# Patient Record
Sex: Female | Born: 1935 | Race: Black or African American | Hispanic: No | Smoking: Current some day smoker
Health system: Southern US, Community
[De-identification: ages and names within clinical notes are randomized; demographics above are authoritative.]

## PROBLEM LIST (undated history)

## (undated) DIAGNOSIS — I10 Essential (primary) hypertension: Secondary | ICD-10-CM

## (undated) DIAGNOSIS — M1990 Unspecified osteoarthritis, unspecified site: Secondary | ICD-10-CM

## (undated) DIAGNOSIS — K219 Gastro-esophageal reflux disease without esophagitis: Secondary | ICD-10-CM

## (undated) DIAGNOSIS — K8020 Calculus of gallbladder without cholecystitis without obstruction: Secondary | ICD-10-CM

## (undated) DIAGNOSIS — K259 Gastric ulcer, unspecified as acute or chronic, without hemorrhage or perforation: Secondary | ICD-10-CM

## (undated) HISTORY — PX: CHOLECYSTECTOMY: SHX55

## (undated) HISTORY — DX: Calculus of gallbladder without cholecystitis without obstruction: K80.20

---

## 2011-11-28 ENCOUNTER — Encounter (HOSPITAL_COMMUNITY): Payer: Self-pay | Admitting: *Deleted

## 2011-11-28 ENCOUNTER — Emergency Department (HOSPITAL_COMMUNITY)
Admission: EM | Admit: 2011-11-28 | Discharge: 2011-11-28 | Disposition: A | Discharge status: Home / Self Care | Payer: 59 | Source: Home / Self Care | Attending: Emergency Medicine | Admitting: Emergency Medicine

## 2011-11-28 DIAGNOSIS — H6120 Impacted cerumen, unspecified ear: Secondary | ICD-10-CM

## 2011-11-28 DIAGNOSIS — L259 Unspecified contact dermatitis, unspecified cause: Secondary | ICD-10-CM

## 2011-11-28 MED ORDER — PREDNISONE 5 MG PO KIT: 1.0000 | PACK | Freq: Every day | ORAL | Status: DC

## 2011-11-28 MED ORDER — METHYLPREDNISOLONE ACETATE PF 80 MG/ML IJ SUSP
80.0000 mg | Freq: Once | INTRAMUSCULAR | Status: AC
  Administered 2011-11-28: 80 mg via INTRAMUSCULAR

## 2011-11-28 MED ORDER — METHYLPREDNISOLONE ACETATE 80 MG/ML IJ SUSP
INTRAMUSCULAR | Status: AC
  Filled 2011-11-28: qty 1

## 2011-11-28 MED ORDER — TRIAMCINOLONE ACETONIDE 0.1 % EX CREA: TOPICAL_CREAM | Freq: Three times a day (TID) | CUTANEOUS | Status: AC

## 2011-11-28 NOTE — ED Notes (Signed)
Itchy raised, discolored rash on the right side of her neck

## 2011-11-28 NOTE — ED Provider Notes (Signed)
Chief Complaint  Patient presents with  . Rash    History of Present Illness:   Mrs. Sydney Jordan is a 78 year old female who has had a one-week history of a rash on the right side of her neck. This is nonpainful and only mildly pruritic. She cannot think of anything that she's come in contact with. Specifically she denies any change in cosmetics, soaps, detergents, washing powders, diyer sheets, fabric softeners, or new clothing. She denies any exposure to animals or poison ivy. She's otherwise been in good health with no fever, chills, sore throat, or URI symptoms. Her only other complaint is difficulty hearing and she would like to have her ears checked today.  Review of Systems:  Other than noted above, the patient denies any of the following symptoms: Systemic:  No fever, chills, sweats, weight loss, or fatigue. ENT:  No nasal congestion, rhinorrhea, sore throat, swelling of lips, tongue or throat. Resp:  No cough, wheezing, or shortness of breath. Skin:  No rash, itching, nodules, or suspicious lesions.  PMFSH:  Past medical history, family history, social history, meds, and allergies were reviewed.  Physical Exam:   Vital signs:  BP 170/74  Pulse 82  Temp(Src) 98.2 F (36.8 C) (Oral)  Resp 20  SpO2 100% Gen:  Alert, oriented, in no distress. Skin:  There was a scaly, erythematous eruption on the entire right side of the neck. There no vesicles or maculopapules. This was diffuse redness scaling centrally erythema of her leg. It is nontender to palpation. ENT: She had a cerumen impaction in the right ear canal. There was just a small amount of cerumen left ear canal. After the cerumen had been irrigated clear, the canal and TM appear normal.  Course in Urgent Care Center:   Her ears were irrigated clear of cerumen and thereafter they appeared normal with normal canals and TMs. Thereafter she was given Depo-Medrol 80 mg IM for the allergic reaction. She tolerated this well without any  immediate side effects.  Assessment:   Diagnoses that have been ruled out:  None  Diagnoses that are still under consideration:  None  Final diagnoses:  Contact dermatitis  Cerumen impaction    Plan:   1.  The following meds were prescribed:   New Prescriptions   PREDNISONE 5 MG KIT    Take 1 kit (5 mg total) by mouth daily after breakfast. Prednisone 5 mg 6 day dosepack.  Take as directed.   TRIAMCINOLONE CREAM (KENALOG) 0.1 %    Apply topically 3 (three) times daily.   2.  The patient was instructed in symptomatic care and handouts were given. 3.  The patient was told to return if becoming worse in any way, if no better in 3 or 4 days, and given some red flag symptoms that would indicate earlier return.     Reuben Likes, MD 11/28/11 [phone]

## 2011-11-28 NOTE — Discharge Instructions (Signed)
Contact Dermatitis Contact dermatitis is a reaction to certain substances that touch the skin. Contact dermatitis can be either irritant contact dermatitis or allergic contact dermatitis. Irritant contact dermatitis does not require previous exposure to the substance for a reaction to occur.Allergic contact dermatitis only occurs if you have been exposed to the substance before. Upon a repeat exposure, your body reacts to the substance.  CAUSES  Many substances can cause contact dermatitis. Irritant dermatitis is most commonly caused by repeated exposure to mildly irritating substances, such as:  Makeup.   Soaps.   Detergents.   Bleaches.   Acids.   Metal salts, such as nickel.  Allergic contact dermatitis is most commonly caused by exposure to:  Poisonous plants.   Chemicals (deodorants, shampoos).   Jewelry.   Latex.   Neomycin in triple antibiotic cream.   Preservatives in products, including clothing.  SYMPTOMS  The area of skin that is exposed may develop:  Dryness or flaking.   Redness.   Cracks.   Itching.   Pain or a burning sensation.   Blisters.  With allergic contact dermatitis, there may also be swelling in areas such as the eyelids, mouth, or genitals.  DIAGNOSIS  Your caregiver can usually tell what the problem is by doing a physical exam. In cases where the cause is uncertain and an allergic contact dermatitis is suspected, a patch skin test may be performed to help determine the cause of your dermatitis. TREATMENT Treatment includes protecting the skin from further contact with the irritating substance by avoiding that substance if possible. Barrier creams, powders, and gloves may be helpful. Your caregiver may also recommend:  Steroid creams or ointments applied 2 times daily. For best results, soak the rash area in cool water for 20 minutes. Then apply the medicine. Cover the area with a plastic wrap. You can store the steroid cream in the  refrigerator for a "chilly" effect on your rash. That may decrease itching. Oral steroid medicines may be needed in more severe cases.   Antibiotics or antibacterial ointments if a skin infection is present.   Antihistamine lotion or an antihistamine taken by mouth to ease itching.   Lubricants to keep moisture in your skin.   Burow's solution to reduce redness and soreness or to dry a weeping rash. Mix one packet or tablet of solution in 2 cups cool water. Dip a clean washcloth in the mixture, wring it out a bit, and put it on the affected area. Leave the cloth in place for 30 minutes. Do this as often as possible throughout the day.   Taking several cornstarch or baking soda baths daily if the area is too large to cover with a washcloth.  Harsh chemicals, such as alkalis or acids, can cause skin damage that is like a burn. You should flush your skin for 15 to 20 minutes with cold water after such an exposure. You should also seek immediate medical care after exposure. Bandages (dressings), antibiotics, and pain medicine may be needed for severely irritated skin.  HOME CARE INSTRUCTIONS  Avoid the substance that caused your reaction.   Keep the area of skin that is affected away from hot water, soap, sunlight, chemicals, acidic substances, or anything else that would irritate your skin.   Do not scratch the rash. Scratching may cause the rash to become infected.   You may take cool baths to help stop the itching.   Only take over-the-counter or prescription medicines as directed by your caregiver.     See your caregiver for follow-up care as directed to make sure your skin is healing properly.  SEEK MEDICAL CARE IF:   Your condition is not better after 3 days of treatment.   You seem to be getting worse.   You see signs of infection such as swelling, tenderness, redness, soreness, or warmth in the affected area.   You have any problems related to your medicines.  Document Released:  09/01/2000 Document Revised: 08/24/2011 Document Reviewed: 02/07/2011 [facility] Patient Information 2012 [place], [place].  Blood pressure over the ideal can put you at higher risk for stroke, heart disease, and kidney failure.  For this reason, it's important to try to get your blood pressure as close as possible to the ideal.  The ideal blood pressure is 120/80.  Blood pressures from 120-139 systolic over 80-89 diastolic are labeled as "prehypertension."  This means you are at higher risk of developing hypertension in the future.  Blood pressures in this range are not treated with medication, but lifestyle changes are recommended to prevent progression to hypertension.  Blood pressures of 140 and above systolic over 90 and above diastolic are classified as hypertension and are treated with medications.  Lifestyle changes which can benefit both prehypertension and hypertension include the following:   Salt and sodium restriction.  Weight loss.  Regular exercise.  Avoidance of tobacco.  Avoidance of excess alcohol.  The "D.A.S.H" diet.   People with hypertension and prehypertension should limit their salt intake to less than 1500 mg daily.  Reading the nutrition information on the label of many prepared foods can give you an idea of how much sodium you're consuming at each meal.  Remember that the most important number on the nutrition information is the serving size.  It may be smaller than you think.  Try to avoid adding extra salt at the table.  You may add small amounts of salt while cooking.  Remember that salt is an acquired taste and you may get used to a using a whole lot less salt than you are using now.  Using less salt lets the food's natural flavors come through.  You might want to consider using salt substitutes, potassium chloride, pepper, or blends of herbs and spices to enhance the flavor of your food.  Foods that contain the most salt include: processed meats (like ham, bacon,  lunch meat, sausage, hot dogs, and breakfast meat), chips, pretzels, salted nuts, soups, salty snacks, canned foods, junk food, fast food, restaurant food, mustard, pickles, pizza, popcorn, soy sauce, and worcestershire sauce--quite a list!  You might ask, "Is there anything I can eat?"  The answer is, "yes."  Fruits and vegetables are usually low in salt.  Fresh is better than frozen which is better than canned.  If you have canned vegetables, you can cut down on the salt content by rinsing them in tap water 3 times before cooking.     Weight loss is the second thing you can do to lower your blood pressure.  Getting to and maintaining ideal weight will often normalize your blood pressure and allow you to avoid medications, entirely, cut way down on your dosage of medications, or allow to wean off your meds.  (Note, this should only be done under the supervision of your primary care doctor.)  Of course, weight loss takes time and you may need to be on medication in the meantime.  You shoot for a body mass index of 20-25.  When you go to the urgent  care or to your primary care doctor, they should calculate your BMI.  If you don't know what it is, ask.  You can calculate your BMI with the following formula:  Weight in pounds x 703/ (height in inches) x (height in inches).  There are many good diets out there: Weight Watchers and the D.A.S.H. Diet are the best, but often, just modifying a few factors can be helpful:  Don't skip meals, don't eat out, and keeping a food diary.  I do not recommend fad diets or diet pills which often raise blood pressure.    Everyone should get regular exercise, but this is particularly important for people with high blood pressure.  Just about any exercise is good.  The only exercise which may be harmful is lifting extreme heavy weights.  I recommend moderate exercise such as walking for 30 minutes 5 days a week.  Going to the gym for a 50 minute workout 3 times a week is also good.   This amounts to 150 minutes of exercise weekly.   Anyone with high blood pressure should avoid any use of tobacco.  Tobacco use does not elevate blood pressure, but it increases the risk of heart disease and stroke.  If you are interested in quitting, discuss with your doctor how to quit.  If you are not interested in quitting, ask yourself, "What would my life be like in 10 years if I continue to smoke?"  "How will I know when it is time to quit?"  "How would my life be better if I were to quit."   Excess alcohol intake can raise the blood pressure.  The safe alcohol intake is 2 drinks or less per day for men and 1 drink per day or less for women.   There is a very good diet which I recommend that has been designed for people with blood pressure called the D.A.S.H. Diet (dietary approaches to stop hypertension).  It consists of fruits, vegetables, lean meats, low fat dairy, whole grains, nuts and seeds.  It is very low in salt and sodium.  It has also been found to have other beneficial health effects such as lowering cholesterol and helping lose weight.  It has been developed by the [employer] and can be downloaded from the internet without any cost. Just do a Programmer, multimedia on "D.A.S.H. Diet." or go the NIH website (GolfingPosters.tn).  There are also cookbooks and diet plans that can be gotten from [place] to help you with this diet.   Follow up with a primary care doctor as soon as possible regarding your blood pressure.  It was very high today.  Call [phone] for a referral.

## 2013-12-20 ENCOUNTER — Encounter (HOSPITAL_COMMUNITY): Payer: Self-pay | Admitting: Emergency Medicine

## 2013-12-20 ENCOUNTER — Emergency Department (HOSPITAL_COMMUNITY)
Admission: EM | Admit: 2013-12-20 | Discharge: 2013-12-20 | Disposition: A | Discharge status: Home / Self Care | Payer: Medicare Other | Attending: Emergency Medicine | Admitting: Emergency Medicine

## 2013-12-20 ENCOUNTER — Emergency Department (HOSPITAL_COMMUNITY): Payer: Medicare Other

## 2013-12-20 DIAGNOSIS — Z79899 Other long term (current) drug therapy: Secondary | ICD-10-CM | POA: Insufficient documentation

## 2013-12-20 DIAGNOSIS — K8020 Calculus of gallbladder without cholecystitis without obstruction: Secondary | ICD-10-CM

## 2013-12-20 DIAGNOSIS — F17200 Nicotine dependence, unspecified, uncomplicated: Secondary | ICD-10-CM | POA: Insufficient documentation

## 2013-12-20 DIAGNOSIS — K8050 Calculus of bile duct without cholangitis or cholecystitis without obstruction: Secondary | ICD-10-CM

## 2013-12-20 HISTORY — DX: Gastric ulcer, unspecified as acute or chronic, without hemorrhage or perforation: K25.9

## 2013-12-20 LAB — CBC WITH DIFFERENTIAL/PLATELET
BASOS PCT: 1 % (ref 0–1)
Basophils Absolute: 0.1 10*3/uL (ref 0.0–0.1)
EOS ABS: 0 10*3/uL (ref 0.0–0.7)
EOS PCT: 0 % (ref 0–5)
HEMATOCRIT: 39.6 % (ref 36.0–46.0)
HEMOGLOBIN: 13.4 g/dL (ref 12.0–15.0)
Lymphocytes Relative: 17 % (ref 12–46)
Lymphs Abs: 1.6 10*3/uL (ref 0.7–4.0)
MCH: 25.1 pg — AB (ref 26.0–34.0)
MCHC: 33.8 g/dL (ref 30.0–36.0)
MCV: 74.2 fL — AB (ref 78.0–100.0)
MONO ABS: 0.8 10*3/uL (ref 0.1–1.0)
MONOS PCT: 9 % (ref 3–12)
Neutro Abs: 6.7 10*3/uL (ref 1.7–7.7)
Neutrophils Relative %: 73 % (ref 43–77)
Platelets: 195 10*3/uL (ref 150–400)
RBC: 5.34 MIL/uL — ABNORMAL HIGH (ref 3.87–5.11)
RDW: 15.9 % — ABNORMAL HIGH (ref 11.5–15.5)
WBC: 9.1 10*3/uL (ref 4.0–10.5)

## 2013-12-20 LAB — URINALYSIS, ROUTINE W REFLEX MICROSCOPIC
Glucose, UA: NEGATIVE mg/dL
Hgb urine dipstick: NEGATIVE
KETONES UR: 15 mg/dL — AB
LEUKOCYTES UA: NEGATIVE
NITRITE: NEGATIVE
PH: 6.5 (ref 5.0–8.0)
Protein, ur: 30 mg/dL — AB
Specific Gravity, Urine: 1.027 (ref 1.005–1.030)
Urobilinogen, UA: 1 mg/dL (ref 0.0–1.0)

## 2013-12-20 LAB — URINE MICROSCOPIC-ADD ON

## 2013-12-20 LAB — COMPREHENSIVE METABOLIC PANEL
ALBUMIN: 4.6 g/dL (ref 3.5–5.2)
ALT: 20 U/L (ref 0–35)
AST: 29 U/L (ref 0–37)
Alkaline Phosphatase: 57 U/L (ref 39–117)
BILIRUBIN TOTAL: 0.6 mg/dL (ref 0.3–1.2)
BUN: 18 mg/dL (ref 6–23)
CHLORIDE: 94 meq/L — AB (ref 96–112)
CO2: 22 mEq/L (ref 19–32)
CREATININE: 0.71 mg/dL (ref 0.50–1.10)
Calcium: 10.2 mg/dL (ref 8.4–10.5)
GFR calc Af Amer: >90 mL/min (ref >90)
GFR calc non Af Amer: 79 mL/min — ABNORMAL LOW (ref >90)
Glucose, Bld: 118 mg/dL — ABNORMAL HIGH (ref 70–99)
Potassium: 4.1 mEq/L (ref 3.7–5.3)
Sodium: 134 mEq/L — ABNORMAL LOW (ref 137–147)
TOTAL PROTEIN: 8.4 g/dL — AB (ref 6.0–8.3)

## 2013-12-20 LAB — LIPASE, BLOOD: LIPASE: 27 U/L (ref 11–59)

## 2013-12-20 LAB — I-STAT TROPONIN, ED: Troponin i, poc: 0 ng/mL (ref 0.00–0.08)

## 2013-12-20 MED ORDER — METOCLOPRAMIDE HCL 10 MG PO TABS: 10.0000 mg | ORAL_TABLET | Freq: Four times a day (QID) | ORAL | Status: DC | PRN

## 2013-12-20 MED ORDER — OXYCODONE-ACETAMINOPHEN 5-325 MG PO TABS: 1.0000 | ORAL_TABLET | ORAL | Status: DC | PRN

## 2013-12-20 MED ORDER — ONDANSETRON 8 MG PO TBDP: ORAL_TABLET | ORAL | Status: DC

## 2013-12-20 NOTE — ED Notes (Signed)
Pt encouraged to void when able. 

## 2013-12-20 NOTE — ED Provider Notes (Signed)
CSN: [id number]     Arrival date & time 12/20/13  1010 History   First MD Initiated Contact with Patient 12/20/13 1015     Chief Complaint  Patient presents with  . Abdominal Pain  . Nausea  . Emesis     (Consider location/radiation/quality/duration/timing/severity/associated sxs/prior Treatment) HPI 80 year old female with no major medical history presents with a four-day history of several episodes of intermittent epigastric pain lasting a few hours at a time with nonbloody vomiting occurs after she eats solid foods but not when she drinks liquids; she is not currently having abdominal pain now; she is no fever no cough no chest pain no shortness breath no lower abdominal pain no bloody stools no diarrhea no dysuria no rash no trauma no treatment prior to arrival; her episodes are moderately severe when she has them. Past Medical History  Diagnosis Date  . Multiple gastric ulcers    History reviewed. No pertinent past surgical history. History reviewed. No pertinent family history. History  Substance Use Topics  . Smoking status: Current Some Day Smoker -- 0.50 packs/day for 50 years  . Smokeless tobacco: Not on file  . Alcohol Use: No   OB History   Grav Para Term Preterm Abortions TAB SAB Ect Mult Living                 Review of Systems  10 Systems reviewed and are negative for acute change except as noted in the HPI.  Allergies  Review of patient's allergies indicates no known allergies.  Home Medications   Current Outpatient Rx  Name  Route  Sig  Dispense  Refill  . ranitidine (ZANTAC) 150 MG tablet   Oral   Take 150 mg by mouth 2 (two) times daily.         . metoCLOPramide (REGLAN) 10 MG tablet   Oral   Take 1 tablet (10 mg total) by mouth every 6 (six) hours as needed for nausea (nausea/headache).   6 tablet   0   . ondansetron (ZOFRAN ODT) 8 MG disintegrating tablet      8mg  ODT q4 hours prn nausea   4 tablet   0   . oxyCODONE-acetaminophen  (PERCOCET) 5-325 MG per tablet   Oral   Take 1 tablet by mouth every 4 (four) hours as needed.   10 tablet   0    BP 128/76  Pulse 78  Temp(Src) 98 F (36.7 C) (Oral)  Resp 17  SpO2 98% Physical Exam  Nursing note and vitals reviewed. Constitutional:  Awake, alert, nontoxic appearance.  HENT:  Head: Atraumatic.  Eyes: Right eye exhibits no discharge. Left eye exhibits no discharge.  Neck: Neck supple.  Cardiovascular: Normal rate and regular rhythm.   No murmur heard. Pulmonary/Chest: Effort normal and breath sounds normal. No respiratory distress. She has no wheezes. She has no rales. She exhibits no tenderness.  Abdominal: Soft. Bowel sounds are normal. She exhibits no distension and no mass. There is no tenderness. There is no rebound and no guarding.  Musculoskeletal: She exhibits no tenderness.  Baseline ROM, no obvious new focal weakness.  Neurological: She is alert.  Mental status and motor strength appears baseline for patient and situation. Normal speech and gait.  Skin: No rash noted.  Psychiatric: She has a normal mood and affect.    ED Course  Procedures (including critical care time) Patient / Family / Caregiver understand and agree with initial ED impression and plan with expectations set for ED  visit. Pain-free in ED. Suspect biliary colic. Appears stable for outpatient followup with surgery. Patient / Family / Caregiver informed of clinical course, understand medical decision-making process, and agree with plan. Labs Review Labs Reviewed  CBC WITH DIFFERENTIAL - Abnormal; Notable for the following:    RBC 5.34 (*)    MCV 74.2 (*)    MCH 25.1 (*)    RDW 15.9 (*)    All other components within normal limits  COMPREHENSIVE METABOLIC PANEL - Abnormal; Notable for the following:    Sodium 134 (*)    Chloride 94 (*)    Glucose, Bld 118 (*)    Total Protein 8.4 (*)    GFR calc non Af Amer 79 (*)    All other components within normal limits  URINALYSIS,  ROUTINE W REFLEX MICROSCOPIC - Abnormal; Notable for the following:    Color, Urine AMBER (*)    Bilirubin Urine SMALL (*)    Ketones, ur 15 (*)    Protein, ur 30 (*)    All other components within normal limits  LIPASE, BLOOD  URINE MICROSCOPIC-ADD ON  I-STAT TROPOININ, ED   Imaging Review US Abdomen Complete  12/20/2013   CLINICAL DATA:  epigastric pain  EXAM: ULTRASOUND ABDOMEN COMPLETE  COMPARISON:  None.  FINDINGS: Gallbladder:  Multiple gallstones are appreciated within the gallbladder largest measuring 9 mm in diameter. No gallbladder wall thickening, measuring 2 mm. No sonographic Murphy's sign. The gallbladder is distended.  Common bile duct:  Diameter: 3 mm  Liver:  No focal lesion identified. Within normal limits in parenchymal echogenicity.  IVC:  No abnormality visualized.  Pancreas:  Visualized portion unremarkable.  Spleen:  Size and appearance within normal limits.  Right Kidney:  Length: 10.4 cm. Echogenicity within normal limits. No mass or hydronephrosis visualized.  Left Kidney:  Length: 9.5 cm. Echogenicity within normal limits. No mass or hydronephrosis visualized.  Abdominal aorta:  No aneurysm visualized, mural calcifications are appreciated within the aortic wall. .  Other findings:  None.  IMPRESSION: Gallstones without evidence of acute cholecystitis.   Electronically Signed   By: Margaree Mackintosh M.D.   On: 12/20/2013 11:56     EKG Interpretation   Date/Time:  Saturday December 20 2013 10:27:26 EDT Ventricular Rate:  87 PR Interval:  161 QRS Duration: 61 QT Interval:  361 QTC Calculation: 434 R Axis:   78 Text Interpretation:  Sinus rhythm Probable left atrial enlargement  Baseline wander in lead(s) V2 No previous ECGs available Confirmed by  [facility]  MD, Jenny Reichmann ([id number]) on 12/20/2013 10:34:59 AM      MDM   Final diagnoses:  Biliary colic  Cholelithiasis    I doubt any other EMC precluding discharge at this time including, but not necessarily limited to the  following:cholecystitis.    Babette Relic, MD 12/20/13 [phone]

## 2013-12-20 NOTE — ED Notes (Signed)
US at bedside

## 2013-12-20 NOTE — ED Notes (Signed)
Pt. Aware of giving a urine specimen.  Pt. Unable to urinate at this time. Will collect. Nurses was notified.

## 2013-12-20 NOTE — ED Notes (Signed)
Pt c/o abd pain and vomiting since Wednesday.  Denies diarrhea.  C/o mid abd pain.  Hx of gastric ulcers.

## 2013-12-20 NOTE — ED Notes (Signed)
Pt denies vomiting blood.

## 2013-12-20 NOTE — Discharge Instructions (Signed)
Biliary Colic  °Biliary colic is a steady or irregular pain in the upper abdomen. It is usually under the right side of the rib cage. It happens when gallstones interfere with the normal flow of bile from the gallbladder. Bile is a liquid that helps to digest fats. Bile is made in the liver and stored in the gallbladder. When you eat a meal, bile passes from the gallbladder through the cystic duct and the common bile duct into the small intestine. There, it mixes with partially digested food. If a gallstone blocks either of these ducts, the normal flow of bile is blocked. The muscle cells in the bile duct contract forcefully to try to move the stone. This causes the pain of biliary colic.  °SYMPTOMS  °· A person with biliary colic usually complains of pain in the upper abdomen. This pain can be: °· In the center of the upper abdomen just below the breastbone. °· In the upper-right part of the abdomen, near the gallbladder and liver. °· Spread back toward the right shoulder blade. °· Nausea and vomiting. °· The pain usually occurs after eating. °· Biliary colic is usually triggered by the digestive system's demand for bile. The demand for bile is high after fatty meals. Symptoms can also occur when a person who has been fasting suddenly eats a very large meal. Most episodes of biliary colic pass after 1 to 5 hours. After the most intense pain passes, your abdomen may continue to ache mildly for about 24 hours. °DIAGNOSIS  °After you describe your symptoms, your caregiver will perform a physical exam. He or she will pay attention to the upper right portion of your belly (abdomen). This is the area of your liver and gallbladder. An ultrasound will help your caregiver look for gallstones. Specialized scans of the gallbladder may also be done. Blood tests may be done, especially if you have fever or if your pain persists. °PREVENTION  °Biliary colic can be prevented by controlling the risk factors for gallstones. Some of  these risk factors, such as heredity, increasing age, and pregnancy are a normal part of life. Obesity and a high-fat diet are risk factors you can change through a healthy lifestyle. Women going through menopause who take hormone replacement therapy (estrogen) are also more likely to develop biliary colic. °TREATMENT  °· Pain medication may be prescribed. °· You may be encouraged to eat a fat-free diet. °· If the first episode of biliary colic is severe, or episodes of colic keep retuning, surgery to remove the gallbladder (cholecystectomy) is usually recommended. This procedure can be done through small incisions using an instrument called a laparoscope. The procedure often requires a brief stay in the hospital. Some people can leave the hospital the same day. It is the most widely used treatment in people troubled by painful gallstones. It is effective and safe, with no complications in more than 90% of cases. °· If surgery cannot be done, medication that dissolves gallstones may be used. This medication is expensive and can take months or years to work. Only small stones will dissolve. °· Rarely, medication to dissolve gallstones is combined with a procedure called shock-wave lithotripsy. This procedure uses carefully aimed shock waves to break up gallstones. In many people treated with this procedure, gallstones form again within a few years. °PROGNOSIS  °If gallstones block your cystic duct or common bile duct, you are at risk for repeated episodes of biliary colic. There is also a 25% chance that you will develop   a gallbladder infection(acute cholecystitis), or some other complication of gallstones within 10 to 20 years. If you have surgery, schedule it at a time that is convenient for you and at a time when you are not sick. HOME CARE INSTRUCTIONS   Drink plenty of clear fluids.  Avoid fatty, greasy or fried foods, or any foods that make your pain worse.  Take medications as directed. SEEK MEDICAL  CARE IF:   You develop a fever over 100.5 F (38.1 C).  Your pain gets worse over time.  You develop nausea that prevents you from eating and drinking.  You develop vomiting. SEEK IMMEDIATE MEDICAL CARE IF:   You have continuous or severe belly (abdominal) pain which is not relieved with medications.  You develop nausea and vomiting which is not relieved with medications.  You have symptoms of biliary colic and you suddenly develop a fever and shaking chills. This may signal cholecystitis. Call your caregiver immediately.  You develop a yellow color to your skin or the white part of your eyes (jaundice). Document Released: 02/05/2006 Document Revised: 11/27/2011 Document Reviewed: 04/16/2008 [facility] Patient Information 2014 [employer]. Abdominal (belly) pain can be caused by many things. Your caregiver performed an examination and possibly ordered blood/urine tests and imaging (CT scan, x-rays, ultrasound). Many cases can be observed and treated at home after initial evaluation in the emergency department. Even though you are being discharged home, abdominal pain can be unpredictable. Therefore, you need a repeated exam if your pain does not resolve, returns, or worsens. Most patients with abdominal pain don't have to be admitted to the hospital or have surgery, but serious problems like appendicitis and gallbladder attacks can start out as nonspecific pain. Many abdominal conditions cannot be diagnosed in one visit, so follow-up evaluations are very important. SEEK IMMEDIATE MEDICAL ATTENTION IF: The pain becomes severe or lasts over 6 hours.  A temperature above 101 develops.  Repeated vomiting occurs (multiple episodes).  The pain becomes localized to portions of the abdomen. The right side could possibly be appendicitis. In an adult, the left lower portion of the abdomen could be colitis or diverticulitis.  Blood is being passed in stools or vomit (bright red or black tarry  stools).  Return also if you develop chest pain, difficulty breathing, dizziness or fainting, or become confused, poorly responsive, or inconsolable (young children).

## 2014-01-06 ENCOUNTER — Encounter (INDEPENDENT_AMBULATORY_CARE_PROVIDER_SITE_OTHER): Payer: Self-pay | Admitting: Surgery

## 2014-01-06 ENCOUNTER — Ambulatory Visit (INDEPENDENT_AMBULATORY_CARE_PROVIDER_SITE_OTHER): Payer: Medicare Other | Admitting: Surgery

## 2014-01-06 VITALS — BP 140/76 | HR 80 | Temp 97.4°F | Resp 14 | Ht 63.0 in | Wt 90.0 lb

## 2014-01-06 DIAGNOSIS — K8020 Calculus of gallbladder without cholecystitis without obstruction: Secondary | ICD-10-CM

## 2014-01-06 NOTE — Progress Notes (Signed)
Patient ID: Sydney Jordan, female   DOB: [birth year], 80 y.o.   MRN: [id number]  Chief Complaint  Patient presents with  . New Evaluation    eval gallstones    HPI Sydney Jordan is a 80 y.o. female.   HPI Patient sent a request of Dr. Teena Irani for symptomatic cholelithiasis. She has a history of epigastric pain after eating and significant nausea and vomiting after eating. This has been going on for least a year. Ultrasound showed gallstones. He seen the emergency room with EKG changes and referred to cardiology.Denies any chest pain or shortness of breath with exertion. Discomfort and nausea occur after eating especially fatty greasy meals. No blood in her emesis.  Past Medical History  Diagnosis Date  . Multiple gastric ulcers     History reviewed. No pertinent past surgical history.  History reviewed. No pertinent family history.  Social History History  Substance Use Topics  . Smoking status: Current Some Day Smoker -- 0.50 packs/day for 50 years  . Smokeless tobacco: Not on file  . Alcohol Use: No    No Known Allergies  Current Outpatient Prescriptions  Medication Sig Dispense Refill  . metoCLOPramide (REGLAN) 10 MG tablet Take 1 tablet (10 mg total) by mouth every 6 (six) hours as needed for nausea (nausea/headache).  6 tablet  0  . ondansetron (ZOFRAN ODT) 8 MG disintegrating tablet 8mg  ODT q4 hours prn nausea  4 tablet  0  . oxyCODONE-acetaminophen (PERCOCET) 5-325 MG per tablet Take 1 tablet by mouth every 4 (four) hours as needed.  10 tablet  0  . ranitidine (ZANTAC) 150 MG tablet Take 150 mg by mouth 2 (two) times daily.       No current facility-administered medications for this visit.    Review of Systems Review of Systems  Constitutional: Negative for fever, chills and unexpected weight change.  HENT: Negative for congestion, hearing loss, sore throat, trouble swallowing and voice change.   Eyes: Negative for visual disturbance.  Respiratory: Negative for  cough and wheezing.   Cardiovascular: Negative for chest pain, palpitations and leg swelling.  Gastrointestinal: Positive for vomiting and abdominal pain. Negative for nausea, diarrhea, constipation, blood in stool, abdominal distention and anal bleeding.  Genitourinary: Negative for hematuria, vaginal bleeding and difficulty urinating.  Musculoskeletal: Negative for arthralgias.  Skin: Negative for rash and wound.  Neurological: Negative for seizures, syncope and headaches.  Hematological: Negative for adenopathy. Does not bruise/bleed easily.  Psychiatric/Behavioral: Negative for confusion.    Blood pressure 140/76, pulse 80, temperature 97.4 F (36.3 C), temperature source Temporal, resp. rate 14, height 5\' 3"  (1.6 m), weight 90 lb (40.824 kg).  Physical Exam Physical Exam  Constitutional: She is oriented to person, place, and time. She appears well-developed and well-nourished.  HENT:  Head: Normocephalic.  Mouth/Throat: No oropharyngeal exudate.  Eyes: EOM are normal. Pupils are equal, round, and reactive to light. No scleral icterus.  Neck: Normal range of motion. Neck supple.  Cardiovascular: Normal rate and regular rhythm.   Pulmonary/Chest: Effort normal and breath sounds normal.  Abdominal: Soft. Bowel sounds are normal. She exhibits no distension. There is tenderness in the right upper quadrant.  Musculoskeletal: Normal range of motion.  Neurological: She is alert and oriented to person, place, and time.  Skin: Skin is warm and dry.  Psychiatric: She has a normal mood and affect. Her behavior is normal. Judgment and thought content normal.    Data Reviewed CLINICAL DATA: epigastric pain  EXAM:  ULTRASOUND  ABDOMEN COMPLETE  COMPARISON: None.  FINDINGS:  Gallbladder:  Multiple gallstones are appreciated within the gallbladder largest  measuring 9 mm in diameter. No gallbladder wall thickening,  measuring 2 mm. No sonographic Murphy's sign. The gallbladder is   distended.  Common bile duct:  Diameter: 3 mm  Liver:  No focal lesion identified. Within normal limits in parenchymal  echogenicity.  IVC:  No abnormality visualized.  Pancreas:  Visualized portion unremarkable.  Spleen:  Size and appearance within normal limits.  Right Kidney:  Length: 10.4 cm. Echogenicity within normal limits. No mass or  hydronephrosis visualized.  Left Kidney:  Length: 9.5 cm. Echogenicity within normal limits. No mass or  hydronephrosis visualized.  Abdominal aorta:  No aneurysm visualized, mural calcifications are appreciated within  the aortic wall. .  Other findings:  None.  IMPRESSION:  Gallstones without evidence of acute cholecystitis.  Electronically Signed  By: Salome Holmes M.D.  On: 12/20/2013 11:56   Assessment    Symptomatic cholelithiasis  EKG changes    Plan    Recommend laparoscopic cholecystectomy and intraoperative cholangiogram after cardiology clearance.The procedure has been discussed with the patient. Operative and non operative treatments have been discussed. Risks of surgery include bleeding, infection,  Common bile duct injury,  Injury to the stomach,liver, colon,small intestine, abdominal wall,  Diaphragm,  Major blood vessels,  And the need for an open procedure.  Other risks include worsening of medical problems, death,  DVT and pulmonary embolism, and cardiovascular events.   Medical options have also been discussed. The patient has been informed of long term expectations of surgery and non surgical options,  The patient agrees to proceed.         Gyan Cambre A. Chayse Zatarain 01/06/2014, 3:46 PM

## 2014-01-06 NOTE — Patient Instructions (Signed)
Laparoscopic Cholecystectomy °Laparoscopic cholecystectomy is surgery to remove the gallbladder. The gallbladder is located in the upper right part of the abdomen, behind the liver. It is a storage sac for bile produced in the liver. Bile aids in the digestion and absorption of fats. Cholecystectomy is often done for inflammation of the gallbladder (cholecystitis). This condition is usually caused by a buildup of gallstones (cholelithiasis) in your gallbladder. Gallstones can block the flow of bile, resulting in inflammation and pain. In severe cases, emergency surgery may be required. When emergency surgery is not required, you will have time to prepare for the procedure. °Laparoscopic surgery is an alternative to open surgery. Laparoscopic surgery has a shorter recovery time. Your common bile duct may also need to be examined during the procedure. If stones are found in the common bile duct, they may be removed. °LET YOUR HEALTH CARE PROVIDER KNOW ABOUT: °· Any allergies you have. °· All medicines you are taking, including vitamins, herbs, eye drops, creams, and over-the-counter medicines. °· Previous problems you or members of your family have had with the use of anesthetics. °· Any blood disorders you have. °· Previous surgeries you have had. °· Medical conditions you have. °RISKS AND COMPLICATIONS °Generally, this is a safe procedure. However, as with any procedure, complications can occur. Possible complications include: °· Infection. °· Damage to the common bile duct, nerves, arteries, veins, or other internal organs such as the stomach, liver, or intestines. °· Bleeding. °· A stone may remain in the common bile duct. °· A bile leak from the cyst duct that is clipped when your gallbladder is removed. °· The need to convert to open surgery, which requires a larger incision in the abdomen. This may be necessary if your surgeon thinks it is not safe to continue with a laparoscopic procedure. °BEFORE THE  PROCEDURE °· Ask your health care provider about changing or stopping any regular medicines. You will need to stop taking aspirin or blood thinners at least 5 days prior to surgery. °· Do not eat or drink anything after midnight the night before surgery. °· Let your health care provider know if you develop a cold or other infectious problem before surgery. °PROCEDURE  °· You will be given medicine to make you sleep through the procedure (general anesthetic). A breathing tube will be placed in your mouth. °· When you are asleep, your surgeon will make several small cuts (incisions) in your abdomen. °· A thin, lighted tube with a tiny camera on the end (laparoscope) is inserted through one of the small incisions. The camera on the laparoscope sends a picture to a TV screen in the operating room. This gives the surgeon a good view inside your abdomen. °· A gas will be pumped into your abdomen. This expands your abdomen so that the surgeon has more room to perform the surgery. °· Other tools needed for the procedure are inserted through the other incisions. The gallbladder is removed through one of the incisions. °· After the removal of your gallbladder, the incisions will be closed with stitches, staples, or skin glue. °AFTER THE PROCEDURE °· You will be taken to a recovery area where your progress will be checked often. °· You may be allowed to go home the same day if your pain is controlled and you can tolerate liquids. °Document Released: 09/04/2005 Document Revised: 06/25/2013 Document Reviewed: 04/16/2013 °[employer]® Patient Information ©2014 [employer]. ° °

## 2014-01-19 ENCOUNTER — Encounter: Payer: Self-pay | Admitting: Cardiovascular Disease

## 2014-01-19 ENCOUNTER — Ambulatory Visit (INDEPENDENT_AMBULATORY_CARE_PROVIDER_SITE_OTHER): Payer: Medicare Other | Admitting: Cardiovascular Disease

## 2014-01-19 VITALS — BP 138/72 | HR 69 | Ht 63.0 in | Wt 90.8 lb

## 2014-01-19 DIAGNOSIS — K8020 Calculus of gallbladder without cholecystitis without obstruction: Secondary | ICD-10-CM

## 2014-01-19 DIAGNOSIS — Z720 Tobacco use: Secondary | ICD-10-CM

## 2014-01-19 DIAGNOSIS — F17200 Nicotine dependence, unspecified, uncomplicated: Secondary | ICD-10-CM

## 2014-01-19 DIAGNOSIS — Z01810 Encounter for preprocedural cardiovascular examination: Secondary | ICD-10-CM

## 2014-01-19 NOTE — Patient Instructions (Signed)
Your physician recommends that you schedule a follow-up appointment as needed with Dr. Angelena Form

## 2014-01-19 NOTE — Progress Notes (Signed)
     History of Present Illness: 80 yo female with history of tobacco abuse, gallstones here today for cardiac evaluation and risk factor assessment prior to planned laparoscopic cholecystectomy per Dr. Brantley Stage. She has no prior cardiac history. She has had recent epigastric pain. She tells me that she has been very healthy her entire life. She rarely sees a doctor. She has no prior surgeries. She is very active and has no chest pain or SOB. No LE edema. She does smoke 1/2ppd for 65 years.   Primary Care Physician: Jonathon Jordan, MD   Past Medical History  Diagnosis Date  . Multiple gastric ulcers     Past Surgical History  Procedure Laterality Date  . None      Current Outpatient Prescriptions  Medication Sig Dispense Refill  . metoCLOPramide (REGLAN) 10 MG tablet Take 1 tablet (10 mg total) by mouth every 6 (six) hours as needed for nausea (nausea/headache).  6 tablet  0  . ondansetron (ZOFRAN ODT) 8 MG disintegrating tablet 8mg  ODT q4 hours prn nausea  4 tablet  0  . oxyCODONE-acetaminophen (PERCOCET) 5-325 MG per tablet Take 1 tablet by mouth every 4 (four) hours as needed.  10 tablet  0  . ranitidine (ZANTAC) 150 MG tablet Take 150 mg by mouth 2 (two) times daily.       No current facility-administered medications for this visit.    No Known Allergies  History   Social History  . Marital Status: Legally Separated    Spouse Name: N/A    Number of Children: 4  . Years of Education: N/A   Occupational History  . Retired    Social History Main Topics  . Smoking status: Current Some Day Smoker -- 0.50 packs/day for 65 years  . Smokeless tobacco: Not on file  . Alcohol Use: No  . Drug Use: No  . Sexual Activity: Yes    Birth Control/ Protection: Post-menopausal   Other Topics Concern  . Not on file   Social History Narrative  . No narrative on file    Family History  Problem Relation Age of Onset  . CAD Neg Hx     Review of Systems:  As stated in the HPI  and otherwise negative.   BP 138/72  Pulse 69  Ht 5\' 3"  (1.6 m)  Wt 90 lb 12.8 oz (41.187 kg)  BMI 16.09 kg/m2  Physical Examination: General: Well developed, well nourished, NAD HEENT: OP clear, mucus membranes moist SKIN: warm, dry. No rashes. Neuro: No focal deficits Musculoskeletal: Muscle strength 5/5 all ext Psychiatric: Mood and affect normal Neck: No JVD, no carotid bruits, no thyromegaly, no lymphadenopathy. Lungs:Clear bilaterally, no wheezes, rhonci, crackles Cardiovascular: Regular rate and rhythm. No murmurs, gallops or rubs. Abdomen:Soft. Bowel sounds present. Non-tender.  Extremities: No lower extremity edema. Pulses are 2 + in the bilateral DP/PT.  EKG: NSR, rate 69 bpm.   Assessment and Plan:   1. Pre-operative cardiovascular examination:  She is a very active elderly female. She has no prior cardiac disease. She has no chest pain or SOB. Her EKG shows sinus rhythm without any ischemic changes. Her risk factors for CAD include tobacco abuse and advanced age. I have discussed risks of peri-operative cardiac events but given her overall good functional state, will not schedule any ischemic testing. She can proceed with her planned surgical procedure.

## 2014-01-27 ENCOUNTER — Telehealth (INDEPENDENT_AMBULATORY_CARE_PROVIDER_SITE_OTHER): Payer: Self-pay

## 2014-01-27 NOTE — Telephone Encounter (Signed)
Called pt, no answer or VM. Received cardiac clearance. Seeing if she ready to schedule sx. Will try pt back.

## 2016-08-26 ENCOUNTER — Emergency Department (HOSPITAL_COMMUNITY): Payer: Medicare Other

## 2016-08-26 ENCOUNTER — Inpatient Hospital Stay (HOSPITAL_COMMUNITY)
Admission: EM | Admit: 2016-08-26 | Discharge: 2016-09-01 | DRG: 853 | Disposition: A | Discharge status: Home / Self Care | Payer: Medicare Other | Attending: General Surgery | Admitting: General Surgery

## 2016-08-26 ENCOUNTER — Emergency Department (HOSPITAL_COMMUNITY): Payer: Medicare Other | Admitting: Certified Registered Nurse Anesthetist

## 2016-08-26 ENCOUNTER — Encounter (HOSPITAL_COMMUNITY): Admission: EM | Disposition: A | Discharge status: Home / Self Care | Payer: Self-pay | Source: Home / Self Care

## 2016-08-26 ENCOUNTER — Encounter (HOSPITAL_COMMUNITY): Payer: Self-pay | Admitting: Emergency Medicine

## 2016-08-26 DIAGNOSIS — A419 Sepsis, unspecified organism: Secondary | ICD-10-CM | POA: Diagnosis present

## 2016-08-26 DIAGNOSIS — R64 Cachexia: Secondary | ICD-10-CM | POA: Diagnosis present

## 2016-08-26 DIAGNOSIS — K8010 Calculus of gallbladder with chronic cholecystitis without obstruction: Secondary | ICD-10-CM

## 2016-08-26 DIAGNOSIS — F17210 Nicotine dependence, cigarettes, uncomplicated: Secondary | ICD-10-CM | POA: Diagnosis present

## 2016-08-26 DIAGNOSIS — Z681 Body mass index (BMI) 19 or less, adult: Secondary | ICD-10-CM

## 2016-08-26 DIAGNOSIS — R198 Other specified symptoms and signs involving the digestive system and abdomen: Secondary | ICD-10-CM

## 2016-08-26 DIAGNOSIS — E876 Hypokalemia: Secondary | ICD-10-CM | POA: Diagnosis present

## 2016-08-26 DIAGNOSIS — K255 Chronic or unspecified gastric ulcer with perforation: Secondary | ICD-10-CM | POA: Diagnosis present

## 2016-08-26 DIAGNOSIS — K567 Ileus, unspecified: Secondary | ICD-10-CM | POA: Diagnosis present

## 2016-08-26 DIAGNOSIS — K275 Chronic or unspecified peptic ulcer, site unspecified, with perforation: Secondary | ICD-10-CM | POA: Diagnosis not present

## 2016-08-26 DIAGNOSIS — M6281 Muscle weakness (generalized): Secondary | ICD-10-CM

## 2016-08-26 DIAGNOSIS — I10 Essential (primary) hypertension: Secondary | ICD-10-CM | POA: Diagnosis present

## 2016-08-26 DIAGNOSIS — K8021 Calculus of gallbladder without cholecystitis with obstruction: Secondary | ICD-10-CM | POA: Diagnosis present

## 2016-08-26 DIAGNOSIS — K660 Peritoneal adhesions (postprocedural) (postinfection): Secondary | ICD-10-CM | POA: Diagnosis present

## 2016-08-26 DIAGNOSIS — K9189 Other postprocedural complications and disorders of digestive system: Secondary | ICD-10-CM | POA: Diagnosis present

## 2016-08-26 DIAGNOSIS — K259 Gastric ulcer, unspecified as acute or chronic, without hemorrhage or perforation: Secondary | ICD-10-CM

## 2016-08-26 DIAGNOSIS — K828 Other specified diseases of gallbladder: Secondary | ICD-10-CM | POA: Diagnosis present

## 2016-08-26 DIAGNOSIS — R410 Disorientation, unspecified: Secondary | ICD-10-CM | POA: Diagnosis present

## 2016-08-26 DIAGNOSIS — Z09 Encounter for follow-up examination after completed treatment for conditions other than malignant neoplasm: Secondary | ICD-10-CM

## 2016-08-26 DIAGNOSIS — K668 Other specified disorders of peritoneum: Secondary | ICD-10-CM | POA: Diagnosis present

## 2016-08-26 DIAGNOSIS — Z8711 Personal history of peptic ulcer disease: Secondary | ICD-10-CM | POA: Diagnosis not present

## 2016-08-26 DIAGNOSIS — D62 Acute posthemorrhagic anemia: Secondary | ICD-10-CM | POA: Diagnosis present

## 2016-08-26 DIAGNOSIS — B370 Candidal stomatitis: Secondary | ICD-10-CM | POA: Diagnosis present

## 2016-08-26 DIAGNOSIS — Z79899 Other long term (current) drug therapy: Secondary | ICD-10-CM

## 2016-08-26 HISTORY — PX: LAPAROTOMY: SHX154

## 2016-08-26 LAB — COMPREHENSIVE METABOLIC PANEL
ALK PHOS: 50 U/L (ref 38–126)
ALT: 17 U/L (ref 14–54)
AST: 35 U/L (ref 15–41)
Albumin: 5.1 g/dL — ABNORMAL HIGH (ref 3.5–5.0)
Anion gap: 15 (ref 5–15)
BILIRUBIN TOTAL: 0.7 mg/dL (ref 0.3–1.2)
BUN: 29 mg/dL — AB (ref 6–20)
CO2: 24 mmol/L (ref 22–32)
CREATININE: 1.1 mg/dL — AB (ref 0.44–1.00)
Calcium: 10.5 mg/dL — ABNORMAL HIGH (ref 8.9–10.3)
Chloride: 98 mmol/L — ABNORMAL LOW (ref 101–111)
GFR calc Af Amer: 52 mL/min — ABNORMAL LOW (ref >60)
GFR, EST NON AFRICAN AMERICAN: 45 mL/min — AB (ref >60)
GLUCOSE: 145 mg/dL — AB (ref 65–99)
Potassium: 3.5 mmol/L (ref 3.5–5.1)
Sodium: 137 mmol/L (ref 135–145)
TOTAL PROTEIN: 9 g/dL — AB (ref 6.5–8.1)

## 2016-08-26 LAB — CBC WITH DIFFERENTIAL/PLATELET
BASOS ABS: 0 10*3/uL (ref 0.0–0.1)
Basophils Relative: 0 %
EOS ABS: 0 10*3/uL (ref 0.0–0.7)
EOS PCT: 0 %
HCT: 46.2 % — ABNORMAL HIGH (ref 36.0–46.0)
Hemoglobin: 15.2 g/dL — ABNORMAL HIGH (ref 12.0–15.0)
Lymphocytes Relative: 16 %
Lymphs Abs: 1 10*3/uL (ref 0.7–4.0)
MCH: 24.6 pg — AB (ref 26.0–34.0)
MCHC: 32.9 g/dL (ref 30.0–36.0)
MCV: 74.6 fL — ABNORMAL LOW (ref 78.0–100.0)
MONO ABS: 0.5 10*3/uL (ref 0.1–1.0)
Monocytes Relative: 7 %
Neutro Abs: 5.1 10*3/uL (ref 1.7–7.7)
Neutrophils Relative %: 77 %
PLATELETS: 266 10*3/uL (ref 150–400)
RBC: 6.19 MIL/uL — AB (ref 3.87–5.11)
RDW: 16.4 % — AB (ref 11.5–15.5)
WBC: 6.7 10*3/uL (ref 4.0–10.5)

## 2016-08-26 LAB — LACTIC ACID, PLASMA
Lactic Acid, Venous: 4.4 mmol/L (ref 0.5–1.9)
Lactic Acid, Venous: 2.6 mmol/L (ref 0.5–1.9)

## 2016-08-26 LAB — LIPASE, BLOOD: Lipase: 26 U/L (ref 11–51)

## 2016-08-26 LAB — I-STAT CG4 LACTIC ACID, ED: LACTIC ACID, VENOUS: 7.41 mmol/L — AB (ref 0.5–1.9)

## 2016-08-26 SURGERY — LAPAROTOMY, EXPLORATORY
Anesthesia: General | Site: Abdomen

## 2016-08-26 MED ORDER — SUGAMMADEX SODIUM 200 MG/2ML IV SOLN
INTRAVENOUS | Status: AC
  Filled 2016-08-26: qty 2

## 2016-08-26 MED ORDER — ONDANSETRON HCL 4 MG/2ML IJ SOLN
4.0000 mg | INTRAMUSCULAR | Status: DC | PRN
  Administered 2016-08-30: 4 mg via INTRAVENOUS
  Filled 2016-08-26 (×2): qty 2

## 2016-08-26 MED ORDER — IOPAMIDOL (ISOVUE-300) INJECTION 61%: 15.0000 mL | Freq: Once | INTRAVENOUS | Status: DC | PRN

## 2016-08-26 MED ORDER — FENTANYL CITRATE (PF) 100 MCG/2ML IJ SOLN
12.5000 ug | INTRAMUSCULAR | Status: DC | PRN
  Administered 2016-08-27 – 2016-08-30 (×9): 12.5 ug via INTRAVENOUS
  Filled 2016-08-26 (×9): qty 2

## 2016-08-26 MED ORDER — SODIUM CHLORIDE 0.9 % IV BOLUS (SEPSIS)
500.0000 mL | Freq: Once | INTRAVENOUS | Status: AC
  Administered 2016-08-26: 500 mL via INTRAVENOUS

## 2016-08-26 MED ORDER — PHENYLEPHRINE HCL 10 MG/ML IJ SOLN
INTRAVENOUS | Status: DC | PRN
  Administered 2016-08-26: 25 ug/min via INTRAVENOUS

## 2016-08-26 MED ORDER — FENTANYL CITRATE (PF) 250 MCG/5ML IJ SOLN
INTRAMUSCULAR | Status: AC
  Filled 2016-08-26: qty 5

## 2016-08-26 MED ORDER — PHENYLEPHRINE HCL 10 MG/ML IJ SOLN
INTRAMUSCULAR | Status: DC | PRN
  Administered 2016-08-26 (×2): 80 ug via INTRAVENOUS

## 2016-08-26 MED ORDER — HYDROMORPHONE HCL 1 MG/ML IJ SOLN
INTRAMUSCULAR | Status: AC
  Filled 2016-08-26: qty 0.5

## 2016-08-26 MED ORDER — LACTATED RINGERS IV SOLN: INTRAVENOUS | Status: DC

## 2016-08-26 MED ORDER — SODIUM CHLORIDE 0.9 % IJ SOLN
INTRAMUSCULAR | Status: AC
  Filled 2016-08-26: qty 50

## 2016-08-26 MED ORDER — 0.9 % SODIUM CHLORIDE (POUR BTL) OPTIME
TOPICAL | Status: DC | PRN
  Administered 2016-08-26: 5000 mL

## 2016-08-26 MED ORDER — PROPOFOL 10 MG/ML IV BOLUS
INTRAVENOUS | Status: DC | PRN
  Administered 2016-08-26: 100 mg via INTRAVENOUS

## 2016-08-26 MED ORDER — ROCURONIUM BROMIDE 50 MG/5ML IV SOSY
PREFILLED_SYRINGE | INTRAVENOUS | Status: DC | PRN
  Administered 2016-08-26: 20 mg via INTRAVENOUS

## 2016-08-26 MED ORDER — ONDANSETRON 4 MG PO TBDP: 4.0000 mg | ORAL_TABLET | Freq: Four times a day (QID) | ORAL | Status: DC | PRN

## 2016-08-26 MED ORDER — FENTANYL CITRATE (PF) 100 MCG/2ML IJ SOLN
25.0000 ug | Freq: Once | INTRAMUSCULAR | Status: AC
  Administered 2016-08-26: 25 ug via INTRAVENOUS
  Filled 2016-08-26: qty 2

## 2016-08-26 MED ORDER — PIPERACILLIN-TAZOBACTAM 3.375 G IVPB: 3.3750 g | Freq: Three times a day (TID) | INTRAVENOUS | Status: DC

## 2016-08-26 MED ORDER — ROCURONIUM BROMIDE 50 MG/5ML IV SOSY
PREFILLED_SYRINGE | INTRAVENOUS | Status: AC
  Filled 2016-08-26: qty 5

## 2016-08-26 MED ORDER — ONDANSETRON HCL 4 MG/2ML IJ SOLN
INTRAMUSCULAR | Status: DC | PRN
  Administered 2016-08-26: 4 mg via INTRAVENOUS

## 2016-08-26 MED ORDER — DEXAMETHASONE SODIUM PHOSPHATE 10 MG/ML IJ SOLN
INTRAMUSCULAR | Status: AC
  Filled 2016-08-26: qty 1

## 2016-08-26 MED ORDER — HEPARIN SODIUM (PORCINE) 5000 UNIT/ML IJ SOLN
5000.0000 [IU] | Freq: Three times a day (TID) | INTRAMUSCULAR | Status: DC
  Administered 2016-08-27 – 2016-08-28 (×4): 5000 [IU] via SUBCUTANEOUS
  Filled 2016-08-26 (×4): qty 1

## 2016-08-26 MED ORDER — VANCOMYCIN HCL 500 MG IV SOLR: 500.0000 mg | INTRAVENOUS | Status: DC

## 2016-08-26 MED ORDER — SUCCINYLCHOLINE CHLORIDE 200 MG/10ML IV SOSY
PREFILLED_SYRINGE | INTRAVENOUS | Status: AC
  Filled 2016-08-26: qty 10

## 2016-08-26 MED ORDER — SODIUM CHLORIDE 0.9 % IV BOLUS (SEPSIS)
1000.0000 mL | Freq: Once | INTRAVENOUS | Status: AC
  Administered 2016-08-26: 1000 mL via INTRAVENOUS

## 2016-08-26 MED ORDER — FENTANYL CITRATE (PF) 100 MCG/2ML IJ SOLN
INTRAMUSCULAR | Status: DC | PRN
  Administered 2016-08-26: 25 ug via INTRAVENOUS
  Administered 2016-08-26: 50 ug via INTRAVENOUS
  Administered 2016-08-26: 25 ug via INTRAVENOUS
  Administered 2016-08-26 (×2): 50 ug via INTRAVENOUS

## 2016-08-26 MED ORDER — DEXAMETHASONE SODIUM PHOSPHATE 10 MG/ML IJ SOLN
INTRAMUSCULAR | Status: DC | PRN
  Administered 2016-08-26: 8 mg via INTRAVENOUS

## 2016-08-26 MED ORDER — VANCOMYCIN HCL IN DEXTROSE 1-5 GM/200ML-% IV SOLN
1000.0000 mg | Freq: Once | INTRAVENOUS | Status: AC
  Administered 2016-08-26: 1000 mg via INTRAVENOUS
  Filled 2016-08-26: qty 200

## 2016-08-26 MED ORDER — LACTATED RINGERS IV SOLN
INTRAVENOUS | Status: DC | PRN
  Administered 2016-08-26 (×2): via INTRAVENOUS

## 2016-08-26 MED ORDER — LACTATED RINGERS IV SOLN
INTRAVENOUS | Status: DC | PRN
  Administered 2016-08-26: 11:00:00 via INTRAVENOUS

## 2016-08-26 MED ORDER — SUCCINYLCHOLINE CHLORIDE 200 MG/10ML IV SOSY
PREFILLED_SYRINGE | INTRAVENOUS | Status: DC | PRN
  Administered 2016-08-26: 120 mg via INTRAVENOUS

## 2016-08-26 MED ORDER — ONDANSETRON HCL 4 MG/2ML IJ SOLN
INTRAMUSCULAR | Status: AC
  Filled 2016-08-26: qty 2

## 2016-08-26 MED ORDER — HYDROMORPHONE HCL 1 MG/ML IJ SOLN
0.2500 mg | INTRAMUSCULAR | Status: DC | PRN
  Administered 2016-08-26: 0.25 mg via INTRAVENOUS

## 2016-08-26 MED ORDER — IOPAMIDOL (ISOVUE-300) INJECTION 61%
INTRAVENOUS | Status: AC
  Filled 2016-08-26: qty 75

## 2016-08-26 MED ORDER — PROPOFOL 10 MG/ML IV BOLUS
INTRAVENOUS | Status: AC
  Filled 2016-08-26: qty 20

## 2016-08-26 MED ORDER — PANTOPRAZOLE SODIUM 40 MG IV SOLR
40.0000 mg | Freq: Two times a day (BID) | INTRAVENOUS | Status: DC
  Administered 2016-08-26 – 2016-08-31 (×11): 40 mg via INTRAVENOUS
  Filled 2016-08-26 (×12): qty 40

## 2016-08-26 MED ORDER — IOPAMIDOL (ISOVUE-300) INJECTION 61%
75.0000 mL | Freq: Once | INTRAVENOUS | Status: AC | PRN
  Administered 2016-08-26: 75 mL via INTRAVENOUS

## 2016-08-26 MED ORDER — PIPERACILLIN-TAZOBACTAM 3.375 G IVPB 30 MIN
3.3750 g | Freq: Once | INTRAVENOUS | Status: AC
  Administered 2016-08-26: 3.375 g via INTRAVENOUS
  Filled 2016-08-26: qty 50

## 2016-08-26 MED ORDER — PIPERACILLIN-TAZOBACTAM 3.375 G IVPB
3.3750 g | Freq: Three times a day (TID) | INTRAVENOUS | Status: DC
  Administered 2016-08-26 – 2016-09-01 (×18): 3.375 g via INTRAVENOUS
  Filled 2016-08-26 (×16): qty 50

## 2016-08-26 MED ORDER — DEXTROSE-NACL 5-0.9 % IV SOLN
INTRAVENOUS | Status: DC
  Administered 2016-08-26 – 2016-08-27 (×2): via INTRAVENOUS

## 2016-08-26 MED ORDER — SODIUM CHLORIDE 0.9 % IV BOLUS (SEPSIS): 1000.0000 mL | Freq: Once | INTRAVENOUS | Status: DC

## 2016-08-26 MED ORDER — MEPERIDINE HCL 50 MG/ML IJ SOLN: 6.2500 mg | INTRAMUSCULAR | Status: DC | PRN

## 2016-08-26 MED ORDER — ONDANSETRON HCL 4 MG/2ML IJ SOLN: 4.0000 mg | Freq: Once | INTRAMUSCULAR | Status: DC | PRN

## 2016-08-26 MED ORDER — LIDOCAINE 2% (20 MG/ML) 5 ML SYRINGE
INTRAMUSCULAR | Status: AC
  Filled 2016-08-26: qty 5

## 2016-08-26 MED ORDER — IOPAMIDOL (ISOVUE-300) INJECTION 61%
INTRAVENOUS | Status: AC
  Administered 2016-08-26: 15 mL
  Filled 2016-08-26: qty 30

## 2016-08-26 MED ORDER — IOPAMIDOL (ISOVUE-300) INJECTION 61%
INTRAVENOUS | Status: AC
  Filled 2016-08-26: qty 50

## 2016-08-26 SURGICAL SUPPLY — 49 items
APPLIER CLIP ROT 13.4 12 LRG (CLIP) ×3
APR CLP LRG 13.4X12 ROT 20 MLT (CLIP) ×1
BLADE EXTENDED COATED 6.5IN (ELECTRODE) ×3 IMPLANT
CATH REDDICK CHOLANGI 4FR 50CM (CATHETERS) ×2 IMPLANT
CHLORAPREP W/TINT 26ML (MISCELLANEOUS) ×3 IMPLANT
CLIP APPLIE ROT 13.4 12 LRG (CLIP) IMPLANT
COVER MAYO STAND STRL (DRAPES) ×3 IMPLANT
COVER SURGICAL LIGHT HANDLE (MISCELLANEOUS) ×1 IMPLANT
DRAIN CHANNEL 19F RND (DRAIN) IMPLANT
DRAPE LAPAROSCOPIC ABDOMINAL (DRAPES) ×3 IMPLANT
DRAPE LG THREE QUARTER DISP (DRAPES) IMPLANT
DRAPE UTILITY XL STRL (DRAPES) ×3 IMPLANT
DRAPE WARM FLUID 44X44 (DRAPE) ×3 IMPLANT
ELECT REM PT RETURN 15FT ADLT (MISCELLANEOUS) ×3 IMPLANT
EVACUATOR SILICONE 100CC (DRAIN) IMPLANT
GAUZE SPONGE 4X4 12PLY STRL (GAUZE/BANDAGES/DRESSINGS) ×3 IMPLANT
GLOVE ECLIPSE 8.0 STRL XLNG CF (GLOVE) ×6 IMPLANT
GLOVE INDICATOR 8.0 STRL GRN (GLOVE) ×3 IMPLANT
GOWN STRL REUS W/TWL XL LVL3 (GOWN DISPOSABLE) ×6 IMPLANT
HANDLE SUCTION POOLE (INSTRUMENTS) ×1 IMPLANT
HEMOSTAT SNOW SURGICEL 2X4 (HEMOSTASIS) ×2 IMPLANT
KIT BASIN OR (CUSTOM PROCEDURE TRAY) ×3 IMPLANT
LEGGING LITHOTOMY PAIR STRL (DRAPES) IMPLANT
LIGASURE IMPACT 36 18CM CVD LR (INSTRUMENTS) ×2 IMPLANT
PACK GENERAL/GYN (CUSTOM PROCEDURE TRAY) ×3 IMPLANT
PAD ABD 8X10 STRL (GAUZE/BANDAGES/DRESSINGS) ×2 IMPLANT
PAD POSITIONING PINK XL (MISCELLANEOUS) ×1 IMPLANT
SEALER TISSUE X1 CVD JAW (INSTRUMENTS) IMPLANT
SPONGE LAP 18X18 X RAY DECT (DISPOSABLE) IMPLANT
STAPLER VISISTAT 35W (STAPLE) ×1 IMPLANT
SUCTION POOLE HANDLE (INSTRUMENTS) ×6
SUT ETHILON 3 0 PS 1 (SUTURE) IMPLANT
SUT NOVA 1 T20/GS 25DT (SUTURE) IMPLANT
SUT PDS AB 1 CTX 36 (SUTURE) ×4 IMPLANT
SUT PDS AB 1 TP1 96 (SUTURE) IMPLANT
SUT SILK 2 0 (SUTURE) ×3
SUT SILK 2 0 SH CR/8 (SUTURE) ×3 IMPLANT
SUT SILK 2-0 18XBRD TIE 12 (SUTURE) ×1 IMPLANT
SUT SILK 3 0 (SUTURE) ×3
SUT SILK 3 0 SH CR/8 (SUTURE) ×3 IMPLANT
SUT SILK 3-0 18XBRD TIE 12 (SUTURE) ×1 IMPLANT
SUT VIC AB 2-0 SH 18 (SUTURE) IMPLANT
SUT VIC AB 3-0 SH 18 (SUTURE) IMPLANT
SYR 20CC LL (SYRINGE) ×4 IMPLANT
TAPE CLOTH SURG 4X10 WHT LF (GAUZE/BANDAGES/DRESSINGS) ×2 IMPLANT
TOWEL OR 17X26 10 PK STRL BLUE (TOWEL DISPOSABLE) ×6 IMPLANT
TOWEL OR NON WOVEN STRL DISP B (DISPOSABLE) ×3 IMPLANT
TRAY FOLEY BAG SILVER LF 14FR (CATHETERS) ×2 IMPLANT
TRAY FOLEY W/METER SILVER 16FR (SET/KITS/TRAYS/PACK) IMPLANT

## 2016-08-26 NOTE — Progress Notes (Signed)
CRITICAL VALUE ALERT  Critical value received:  Lactic Acid 2.6  Date of notification: 08/26/16  Time of notification:  1934  Critical value read back:Yes.    Nurse who received alert:  Bobbye Riggs, RN   MD notified (1st page):  Dr. Zella Richer  Time of first page:  1935  MD notified (2nd page):  Time of second page:  Responding MD: Dr. Zella Richer  Time MD responded:  [phone]

## 2016-08-26 NOTE — Anesthesia Preprocedure Evaluation (Signed)
Anesthesia Evaluation  Patient identified by MRN, date of birth, ID band Patient awake    Reviewed: Allergy & Precautions, NPO status , Patient's Chart, lab work & pertinent test results  Airway Mallampati: I  TM Distance: >3 FB Neck ROM: Full    Dental   Pulmonary Current Smoker,    Pulmonary exam normal        Cardiovascular Normal cardiovascular exam     Neuro/Psych    GI/Hepatic   Endo/Other    Renal/GU      Musculoskeletal   Abdominal   Peds  Hematology   Anesthesia Other Findings   Reproductive/Obstetrics                             Anesthesia Physical Anesthesia Plan  ASA: III and emergent  Anesthesia Plan: General   Post-op Pain Management:    Induction: Intravenous, Rapid sequence and Cricoid pressure planned  Airway Management Planned: Oral ETT  Additional Equipment:   Intra-op Plan:   Post-operative Plan: Extubation in OR  Informed Consent: I have reviewed the patients History and Physical, chart, labs and discussed the procedure including the risks, benefits and alternatives for the proposed anesthesia with the patient or authorized representative who has indicated his/her understanding and acceptance.     Plan Discussed with: CRNA and Surgeon  Anesthesia Plan Comments:         Anesthesia Quick Evaluation

## 2016-08-26 NOTE — Anesthesia Postprocedure Evaluation (Signed)
Anesthesia Post Note  Patient: Justine Null  Procedure(s) Performed: Procedure(s) (LRB): EXPLORATORY LAPAROTOMY, PRIMARY CLOSURE OF PERFORATED PYLORIC ULCER WITH OMENTAL PATCH, OPEN CHOLECYSTECTOMY (N/A)  Patient location during evaluation: PACU Anesthesia Type: General Level of consciousness: awake and alert Pain management: pain level controlled Vital Signs Assessment: post-procedure vital signs reviewed and stable Respiratory status: spontaneous breathing, nonlabored ventilation, respiratory function stable and patient connected to nasal cannula oxygen Cardiovascular status: blood pressure returned to baseline and stable Postop Assessment: no signs of nausea or vomiting Anesthetic complications: no    Last Vitals:  Vitals:   08/26/16 1509 08/26/16 1600  BP: (!) 167/90   Pulse: 71   Resp: 13   Temp: 36.4 C (!) 36.1 C    Last Pain:  Vitals:   08/26/16 1600  TempSrc: Axillary  PainSc:                  Kimba Lottes DAVID

## 2016-08-26 NOTE — ED Provider Notes (Signed)
[facility] DEPT Provider Note   CSN: [id number] Arrival date & time: 08/26/16  4098     History   Chief Complaint Chief Complaint  Patient presents with  . Abdominal Pain    HPI Sydney Jordan is a 82 y.o. female with a past medical history significant for gastric ulcers and cholelithiasis who presents with 1 week of abdominal pain, decreased by mouth intake, and chills. Patient reports that for the last week, she has had progressively worsening abdominal pain. She reports it was initially in her upper abdomen but now hurts "all over". She describes the pain as a 13 out of 10 in severity. She denies nausea or vomiting but says she has lost her appetite and has had decreased by mouth intake. She reports subjective fevers and chills. She reports chronic constipation but denies any changes in bowel movements this week. She had a normal bowel movement yesterday. She denied black or tarry stools. She denies any rectal bleeding. She denies any changes in her urination including no dysuria, frequency, or hesitation. She denies any cough, rhinorrhea, or congestion. She denies chest pain or shortness of breath. She does report some fatigue. She denies any recent abdominal trauma or other symptoms. She says she has had similar pain in the past. She denies a history of kidney stones and says she did not get her gallbladder out when she was diagnosed with gallstones in the past.    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 No language interpreter was used.  Abdominal Pain   This is a new problem. The current episode started more than 2 days ago. The problem occurs constantly. The problem has been gradually worsening. The pain is located in the generalized abdominal region. The quality of the pain is aching and dull. The pain is at a severity of 10/10 (13/10). The pain is severe. Associated symptoms include anorexia, fever (subjective) and constipation  (chronic). Pertinent negatives include diarrhea, hematochezia, melena, nausea, vomiting, dysuria and frequency. The symptoms are aggravated by palpation. Nothing relieves the symptoms. Her past medical history is significant for PUD. Past medical history comments: gallstones.    Past Medical History:  Diagnosis Date  . Cholelithiasis   . Multiple gastric ulcers     Patient Active Problem List   Diagnosis Date Noted  . Tobacco abuse 01/19/2014  . Cholelithiasis 01/19/2014    Past Surgical History:  Procedure Laterality Date  . None      OB History    No data available       Home Medications    Prior to Admission medications   Medication Sig Start Date End Date Taking? Authorizing Provider  metoCLOPramide (REGLAN) 10 MG tablet Take 1 tablet (10 mg total) by mouth every 6 (six) hours as needed for nausea (nausea/headache). 12/20/13   Riki Altes, MD  ondansetron (ZOFRAN ODT) 8 MG disintegrating tablet '8mg'$  ODT q4 hours prn nausea 12/20/13   Riki Altes, MD  oxyCODONE-acetaminophen (PERCOCET) 5-325 MG per tablet Take 1 tablet by mouth every 4 (four) hours as needed. 12/20/13   Riki Altes, MD  ranitidine (ZANTAC) 150 MG tablet Take 150 mg by mouth 2 (two) times daily.    Historical Provider, MD    Family History Family History  Problem Relation Age of Onset  . CAD Neg Hx     Social History Social History  Substance Use Topics  . Smoking status: Current Some Day Smoker    Packs/day: 0.50  Years: 65.00  . Smokeless tobacco: Never Used  . Alcohol use No     Allergies   Patient has no known allergies.   Review of Systems Review of Systems  Constitutional: Positive for chills, fatigue and fever (subjective). Negative for diaphoresis.  HENT: Negative for congestion and rhinorrhea.   Eyes: Negative for visual disturbance.  Respiratory: Negative for cough, chest tightness, shortness of breath, wheezing and stridor.   Cardiovascular: Negative for chest pain, palpitations  and leg swelling.  Gastrointestinal: Positive for abdominal pain, anorexia and constipation (chronic). Negative for abdominal distention, blood in stool, diarrhea, hematochezia, melena, nausea and vomiting.  Genitourinary: Negative for decreased urine volume, dysuria, flank pain, frequency and pelvic pain.  Musculoskeletal: Negative for back pain, neck pain and neck stiffness.  Skin: Negative for rash and wound.  Neurological: Negative for dizziness, seizures and light-headedness.  Psychiatric/Behavioral: Negative for agitation and confusion.  All other systems reviewed and are negative.    Physical Exam Updated Vital Signs BP 102/77 (BP Location: Left Arm)   Pulse 74   Resp 15   Ht '5\' 4"'$  (1.626 m)   Wt 105 lb (47.6 kg)   SpO2 98%   BMI 18.02 kg/m   Physical Exam  Constitutional: She is oriented to person, place, and time. She appears well-developed and well-nourished. No distress.  HENT:  Head: Normocephalic and atraumatic.  Mouth/Throat: Oropharynx is clear and moist. No oropharyngeal exudate.  Eyes: Conjunctivae and EOM are normal. Right pupil is not reactive. Left pupil is round and reactive.    Neck: Normal range of motion. Neck supple.  Cardiovascular: Normal rate, regular rhythm and intact distal pulses.   No murmur heard. Pulmonary/Chest: Effort normal and breath sounds normal. No stridor. No respiratory distress. She has no wheezes. She has no rales. She exhibits no tenderness.  Abdominal: Soft. Bowel sounds are normal. There is tenderness. There is no rebound.  Musculoskeletal: She exhibits no edema.  Neurological: She is alert and oriented to person, place, and time.  Skin: Skin is warm and dry. Capillary refill takes less than 2 seconds. No rash noted. She is not diaphoretic. No erythema.  Psychiatric: She has a normal mood and affect.  Nursing note and vitals reviewed.    ED Treatments / Results  Labs (all labs ordered are listed, but only abnormal results  are displayed) Labs Reviewed  COMPREHENSIVE METABOLIC PANEL - Abnormal; Notable for the following:       Result Value   Chloride 98 (*)    Glucose, Bld 145 (*)    BUN 29 (*)    Creatinine, Ser 1.10 (*)    Calcium 10.5 (*)    Total Protein 9.0 (*)    Albumin 5.1 (*)    GFR calc non Af Amer 45 (*)    GFR calc Af Amer 52 (*)    All other components within normal limits  CBC WITH DIFFERENTIAL/PLATELET - Abnormal; Notable for the following:    RBC 6.19 (*)    Hemoglobin 15.2 (*)    HCT 46.2 (*)    MCV 74.6 (*)    MCH 24.6 (*)    RDW 16.4 (*)    All other components within normal limits  I-STAT CG4 LACTIC ACID, ED - Abnormal; Notable for the following:    Lactic Acid, Venous 7.41 (*)    All other components within normal limits  CULTURE, BLOOD (ROUTINE X 2)  CULTURE, BLOOD (ROUTINE X 2)  URINE CULTURE  LIPASE, BLOOD  URINALYSIS, ROUTINE  W REFLEX MICROSCOPIC  I-STAT CG4 LACTIC ACID, ED    EKG  EKG Interpretation  Date/Time:  Saturday August 26 2016 08:13:06 EST Ventricular Rate:  60 PR Interval:    QRS Duration: 113 QT Interval:  446 QTC Calculation: 446 R Axis:   65 Text Interpretation:  Sinus rhythm Left atrial enlargement Left ventricular hypertrophy No stemi Confirmed by Sherry Ruffing MD, [place] [phone]) on 08/26/2016 10:39:53 AM       Radiology Dg Chest 2 View  Result Date: 08/26/2016 CLINICAL DATA:  Generalized abdominal pain for the past week, migrating to the left upper abdominal quadrant for the past 2 days. History of smoking. EXAM: CHEST  2 VIEW COMPARISON:  None. FINDINGS: On the provided lateral radiograph there is a triangular early faint lucency worrisome for pneumoperitoneum. Normal cardiac silhouette and mediastinal contours. Atherosclerotic plaque when the thoracic aorta. The lungs are hyperexpanded with flattening the diaphragms. No focal airspace opacities. No pleural effusion or pneumothorax. No evidence of edema. No acute osseus abnormalities.  IMPRESSION: 1. Findings worrisome for pneumoperitoneum. Further evaluation with abdominal CT is recommended. 2. Lung hyperexpansion without acute cardiopulmonary disease. 3.  Aortic Atherosclerosis (ICD10-170.0) Critical Value/emergent results were called by telephone at the time of interpretation on 08/26/2016 at 8:37 am to Dr. Marda Stalker , who verbally acknowledged these results. Electronically Signed   By: Sandi Mariscal M.D.   On: 08/26/2016 08:39   Ct Abdomen Pelvis W Contrast  Result Date: 08/26/2016 CLINICAL DATA:  Acute abdominal pain, fevers, chills, free air on chest x-ray earlier today EXAM: CT ABDOMEN AND PELVIS WITH CONTRAST TECHNIQUE: Multidetector CT imaging of the abdomen and pelvis was performed using the standard protocol following bolus administration of intravenous contrast. CONTRAST:  38m ISOVUE-300 IOPAMIDOL (ISOVUE-300) INJECTION 61% COMPARISON:  08/26/2016 FINDINGS: Lower chest: No acute abnormality. Hepatobiliary: Mild diffuse biliary prominence and gallbladder distention, nonspecific. No focal hepatic abnormality. Hepatic and portal veins are patent. Pancreas: Pancreatic duct is visualized measuring 3 mm. No focal pancreatic abnormality or definite mass. Spleen: Normal in size without focal abnormality. Adrenals/Urinary Tract: Adrenal glands are unremarkable. Kidneys are normal, without renal calculi, focal lesion, or hydronephrosis. Bladder is unremarkable. Stomach/Bowel: Moderate amount of abdominopelvic free fluid. Fluid along the right pericolic gutter beneath the liver and gallbladder is high attenuation with 65 Hounsfield units, image 39 this is suspicious for hemoperitoneum. Diffuse pneumoperitoneum present throughout the upper abdomen confirming the chest x-ray finding. Stomach is markedly distended. Duodenum is collapsed but has a prominent wall. Findings are compatible with perforated viscus, suspect peptic ulcer disease suggest gastric or duodenal ulcer perforation.  Distal colonic diverticulosis evident. Vascular/Lymphatic: Atherosclerotic changes of the aorta and tortuosity. No occlusive process. Celiac and SMA remain patent. Calcified nodes suspected in the lower abdominal and pelvic mesentery. Reproductive: Grossly normal for age. Pelvic vascular calcifications noted. Other: No inguinal or abdominal hernia. Musculoskeletal: Degenerative changes of the spine. Bones are osteopenic. Lumbar facet arthropathy noted. Grade 1 anterolisthesis of L5 on S1 secondary to facet arthropathy. No visualized pars defects. No compression fracture. IMPRESSION: Moderate abdominopelvic free fluid. Associated upper abdominal scattered pneumoperitoneum. Pattern of fluid and free air suggest perforated viscus, suspect related to gastric or duodenal ulcer disease. Increased attenuation of the right pericolic gutter fluid suggest a component of hemoperitoneum. Other chronic findings as above These results were called by telephone at the time of interpretation on 08/26/2016 at 9:46 am to Dr. RZella Richer who verbally acknowledged these results. Electronically Signed   By: MJerilynn Mages  Shick M.D.  On: 08/26/2016 09:48    Procedures Procedures (including critical care time)   CRITICAL CARE Performed by: Gwenyth Allegra Tegeler Total critical care time: 45 minutes Critical care time was exclusive of separately billable procedures and treating other patients. Critical care was necessary to treat or prevent imminent or life-threatening deterioration. Critical care was time spent personally by me on the following activities: development of treatment plan with patient and/or surrogate as well as nursing, discussions with consultants, evaluation of patient's response to treatment, examination of patient, obtaining history from patient or surrogate, ordering and performing treatments and interventions, ordering and review of laboratory studies, ordering and review of radiographic studies, pulse oximetry and  re-evaluation of patient's condition.    Medications Ordered in ED Medications  sodium chloride 0.9 % bolus 1,000 mL (1,000 mLs Intravenous Canceled Entry 08/26/16 0748)  iopamidol (ISOVUE-300) 61 % injection 15 mL (not administered)  vancomycin (VANCOCIN) 500 mg in sodium chloride 0.9 % 100 mL IVPB (not administered)  piperacillin-tazobactam (ZOSYN) IVPB 3.375 g (not administered)  iopamidol (ISOVUE-300) 61 % injection (not administered)  sodium chloride 0.9 % injection (not administered)  fentaNYL (SUBLIMAZE) injection 25 mcg (25 mcg Intravenous Given 08/26/16 0747)  iopamidol (ISOVUE-300) 61 % injection (15 mLs  Contrast Given 08/26/16 0746)  sodium chloride 0.9 % bolus 1,000 mL (1,000 mLs Intravenous New Bag/Given 08/26/16 0748)    And  sodium chloride 0.9 % bolus 500 mL (500 mLs Intravenous New Bag/Given 08/26/16 0842)  piperacillin-tazobactam (ZOSYN) IVPB 3.375 g (3.375 g Intravenous New Bag/Given 08/26/16 0843)  vancomycin (VANCOCIN) IVPB 1000 mg/200 mL premix (1,000 mg Intravenous New Bag/Given 08/26/16 0843)  iopamidol (ISOVUE-300) 61 % injection 75 mL (75 mLs Intravenous Contrast Given 08/26/16 0903)     Initial Impression / Assessment and Plan / ED Course  I have reviewed the triage vital signs and the nursing notes.  Pertinent labs & imaging results that were available during my care of the patient were reviewed by me and considered in my medical decision making (see chart for details).  Clinical Course     LANEICE MENEELY is a 82 y.o. female with a past medical history significant for gastric ulcers and cholelithiasis who presents with 1 week of abdominal pain, decreased by mouth intake, and chills.   History and exam are seen above.  Patient has tenderness in all parts of her abdomen. Patient has no CVA tenderness. Patient's lungs are clear. Patient has symmetric pulses in extremities. Patient has no neck stiffness or neck pain. No neck tenderness. Exam otherwise  unremarkable.  Given patient's age and history of gallstones, suspect him to medical cholelithiasis versus cholecystitis however, and absence of nausea or vomiting, and due to the severity of pain, patient will have a CT scan to evaluate for other causes of severe abdominal pain. Patient does report she still has her appendix so this will be assessed better with CT versus a right upper quadrant ultrasound. Patient will have laboratory testing and kidney function checked before contrasted CT scan. Patient will be given fentanyl for pain and will be given fluids due to dehydration this week. Anticipate reassessment following workup.  8:11 AM Rectal temperature was obtained and was low. At the same time, lactic acid is altered and was elevated at 7.4. Patient quickly made a code sepsis, fluids ordered, cultures ordered, and broad-spectrum antibodies were ordered. Patient reassessed and continued to have no acute changes.   8:46 AM Radiology called to report that there is concern for pneumoperitoneum on x-ray  of the chest. They recommended following up on abdominal CT imaging. After this finding, general surgery will be called due to concern for sepsis and pneumoperitoneum.  CT scan showed evidence of pneumoperitoneum and free fluid concerning for perforated viscus. General surgery saw the patient in the ED and will take her to the OR for surgery. Patient had no other complications and was taken to the OR for surgical management. Patient will require admission after OR.    Final Clinical Impressions(s) / ED Diagnoses   Final diagnoses:  Perforated viscus  Sepsis, due to unspecified organism [facility])     Clinical Impression: 1. Perforated viscus   2. Sepsis, due to unspecified organism [facility])     Disposition: Admit to general surgery after operating room    Courtney Paris, MD 08/26/16 1858

## 2016-08-26 NOTE — Progress Notes (Signed)
Pharmacy Antibiotic Note  Sydney Jordan is a 82 y.o. female presenting on 08/26/2016 with upper abd pain and chills x 1 week. PMH gastric ulcers and cholelithiasis.  Pharmacy has been consulted for Vancomycin and Zosyn dosing for sepsis  Plan:  Vancomycin discontinued post-op  Zosyn 3.375 g IV given once over 30 minutes, then every 8 hrs by 4-hr infusion  12/9:  Emergency exploratory laparotomy, primary repair of perforated pyloric ulcer with lesser omentum patch reinforcement, cholecystectomy   Height: '5\' 4"'$  (162.6 cm) Weight: 105 lb (47.6 kg) IBW/kg (Calculated) : 54.7  Temp (24hrs), Avg:96.5 F (35.8 C), Min:95.4 F (35.2 C), Max:97.5 F (36.4 C)   Recent Labs Lab 08/26/16 0750 08/26/16 0757  WBC 6.7  --   CREATININE 1.10*  --   LATICACIDVEN  --  7.41*    Estimated Creatinine Clearance: 28.6 mL/min (by C-G formula based on SCr of 1.1 mg/dL (H)).    No Known Allergies  Antimicrobials this admission: Vancomycin 12/9 >> 12/9 Zosyn 12/9 >>   Dose adjustments this admission:  Microbiology results: 12/9 BCx: sent 12/9 UCx: sent   Thank you for allowing pharmacy to be a part of this patient's care.  Minda Ditto PharmD Pager [phone] 08/26/2016, 3:14 PM

## 2016-08-26 NOTE — Progress Notes (Signed)
Pharmacy Antibiotic Note  Sydney Jordan is a 82 y.o. female presenting on 08/26/2016 with upper abd pain and chills x 1 week. PMH gastric ulcers and cholelithiasis.  Pharmacy has been consulted for Vancomycin and Zosyn dosing for sepsis  Plan:  Vancomycin 1000 mg IV now, then 500 mg IV q24 hr; goal trough 15-20 mcg/mL  Measure vancomycin trough levels at steady state as indicated  Zosyn 3.375 g IV given once over 30 minutes, then every 8 hrs by 4-hr infusion   Height: '5\' 4"'$  (162.6 cm) Weight: 105 lb (47.6 kg) IBW/kg (Calculated) : 54.7  Temp (24hrs), Avg:95.4 F (35.2 C), Min:95.4 F (35.2 C), Max:95.4 F (35.2 C)   Recent Labs Lab 08/26/16 0757  LATICACIDVEN 7.41*    CrCl cannot be calculated (Patient's most recent lab result is older than the maximum 21 days allowed.).    No Known Allergies  Antimicrobials this admission: Vancomycin 12/9 >>  Zosyn 12/9 >>   Dose adjustments this admission: ---  Microbiology results: 12/9 BCx: sent 12/9 UCx: sent    Thank you for allowing pharmacy to be a part of this patient's care.  Reuel Boom, PharmD, BCPS Pager: [phone] 08/26/2016, 8:19 AM

## 2016-08-26 NOTE — Progress Notes (Signed)
eLink Physician-Brief Progress Note Patient Name: Sydney Jordan DOB: [birth year] MRN: [id number]   Date of Service  08/26/2016  HPI/Events of Note  82 yo female with perf GU s/p ex lap and repair. Video >> looks comfortable. Sat = 100% on [place] O2. RR = 10. BP = 167/90. HR = 69. Lactic Acid at 8 AM = 7.4. Empiric Abx Rx with Vancomycin and Zosyn.   eICU Interventions  Will order: 1. Lactic Acid level now and Q 3 hours X 2.   Continue present management per Surgery.      Intervention Category Major Interventions: Acid-Base disturbance - evaluation and management  [name] Eugene 08/26/2016, 3:35 PM

## 2016-08-26 NOTE — ED Notes (Signed)
ED Provider at bedside. 

## 2016-08-26 NOTE — ED Notes (Signed)
Pt to OR. Spoke to OR nurse. They will complete OR checklist and consents in pre-op.

## 2016-08-26 NOTE — Progress Notes (Signed)
CRITICAL VALUE ALERT  Critical value received:  Lactic Acid 4.4  Date of notification:  08/26/2016  Time of notification:  4010  Critical value read back:Yes.    Nurse who received alert:  Jeannie Fend RN  MD notified (1st page):  Dr. Zella Richer  Time of first page:  93  MD notified (2nd page):  Time of second page:  Responding MD:  Dr. Zella Richer  Time MD responded:  1800

## 2016-08-26 NOTE — Transfer of Care (Signed)
Immediate Anesthesia Transfer of Care Note  Patient: Sydney Jordan  Procedure(s) Performed: Procedure(s): EXPLORATORY LAPAROTOMY, PRIMARY CLOSURE OF PERFORATED PYLORIC ULCER WITH OMENTAL PATCH, OPEN CHOLECYSTECTOMY (N/A)  Patient Location: PACU  Anesthesia Type:General  Level of Consciousness: awake, alert  and oriented  Airway & Oxygen Therapy: Patient Spontanous Breathing and Patient connected to face mask oxygen  Post-op Assessment: Report given to RN and Post -op Vital signs reviewed and stable  Post vital signs: Reviewed and stable  Last Vitals:  Vitals:   08/26/16 0759 08/26/16 0804  BP:  150/71  Pulse:  64  Resp:  18  Temp: (!) 35.2 C     Last Pain:  Vitals:   08/26/16 0759  TempSrc: Rectal  PainSc:          Complications: No apparent anesthesia complications

## 2016-08-26 NOTE — ED Triage Notes (Signed)
Patient is complaining of upper abdominal pain. Patient states that this started a week ago. Patient has not been eating regularly. Patient does not have any nausea or vomiting. Patient has a hx of gallstones.

## 2016-08-26 NOTE — Anesthesia Procedure Notes (Signed)
Procedure Name: Intubation Date/Time: 08/26/2016 11:04 AM Performed by: Noralyn Pick D Pre-anesthesia Checklist: Patient identified, Emergency Drugs available, Suction available and Patient being monitored Patient Re-evaluated:Patient Re-evaluated prior to inductionOxygen Delivery Method: Circle system utilized Preoxygenation: Pre-oxygenation with 100% oxygen (Intubation by Virgia Land CRNA) Intubation Type: IV induction, Rapid sequence and Cricoid Pressure applied Laryngoscope Size: Mac and 4 Grade View: Grade I Tube type: Oral Number of attempts: 1 Airway Equipment and Method: Stylet Placement Confirmation: ETT inserted through vocal cords under direct vision,  positive ETCO2 and breath sounds checked- equal and bilateral Secured at: 20 cm Tube secured with: Tape Dental Injury: Teeth and Oropharynx as per pre-operative assessment

## 2016-08-26 NOTE — ED Notes (Signed)
Could not get an oral temp due to patient being cold. Ordered rectal temp.

## 2016-08-26 NOTE — Op Note (Signed)
Operative Note  Sydney Jordan female 82 y.o. 08/26/2016  PREOPERATIVE DX:  Perforated viscous with sepsis  POSTOPERATIVE DX:  Perforated pyloric ulcer with sepsis.  Cystic duct obstruction from gallstones.  PROCEDURE:   Emergency exploratory laparotomy, primary repair of perforated pyloric ulcer with lesser omentum patch reinforcement, cholecystectomy         Surgeon: Odis Hollingshead   Assistants: Georganna Skeans, M.D.  Anesthesia: General endotracheal anesthesia  Indications:   This is an 82 year old female who presented to the emergency department with an acute abdomen and sepsis. She is found to have a pneumoperitoneum. She is given intravenous antibiotics and is brought to the operating room now for emergency exploratory laparotomy.    Procedure Detail:  She was brought to the operating room placed supine on the operating table in the general anesthetic was given. The abdominal wall was sterilely prepped and draped. A timeout was performed.  Midline incision was made dividing skin, subcutaneous tissue, and fascia and peritoneum. Once entering the peritoneal cavity a large amount of cloudy fluid was evacuated.  The pylorus there was a spot of greenish fluid present. There were adhesions between the gallbladder and lesser omentum to this area. The entire small bowel and colon were inspected. There is no evidence of perforation. The appendix was normal. The gallbladder was tense and distended. There were 3 stones impacted in the cystic duct (of note is that her liver function tests are normal). There were gallbladder adhesions to the pylorus and duodenum. I lysed these and mobilized the gallbladder free from this area.  I then gently lysed some inflammatory adhesions and mobilized the pylorus. A punched out 8 mm ulcer was noted at the superior portion of pylorus. This was primarily repaired with interrupted 2-0 silk sutures. At then placed a patch of lesser omentum over this and anchored  it to the duodenum.  This provided for a solid closure of the perforated ulcer.  The gallbladder was reexamined. He was tense and distended. Stones were impacted into the cystic duct. I mobilized the gallbladder with blunt dissection close to the gallbladder and using electrocautery. I decided to perform a cholecystectomy. I could not milk the stones back into the gallbladder without potentially tearing the cystic duct. The cystic artery was identified in the triangle low. It was clipped and divided. I then isolated the cystic duct and created a large window around it down close to its insertion with the common hepatic duct from the common bile duct. I was able to get below the impacted stones on the cystic duct and placed hemoclips there. I then clipped and divided the cystic duct. The gallbladder was then dissected free from the liver. There was no evidence of bile leak. The gallbladder was sent to pathology.  The abdominal cavity was then copiously irrigated with warm saline solution. The abdominal cavity was inspected. There is no evidence of bleeding or organ injury.  A piece of Surgicel was placed in the gallbladder fossa. The midline fascia was then closed with running #1 PDS suture. The subcutaneous tissue was packed with saline moistened gauze and a dry dressing was applied.  She tolerated the procedure well, without any apparent complications, and was taken to the recovery room extubated and in fair condition. She'll be admitted to the intensive care unit.  Findings:  Perforated ulcer at the pylorus. Obstructed cystic duct with 3 impacted stones and tense distended gallbladder.  Estimated Blood Loss:  200 mL  Specimens: Gallbladder        Complications:  * No complications entered in OR log *

## 2016-08-26 NOTE — H&P (Signed)
Sydney Jordan is an 82 y.o. female.   Chief Complaint:  Worsening abdominal pain HPI:  This is an 82 year old female with history of symptomatic cholelithiasis and gastric ulcers presented to the emergency department with a one-week history of increasing abdominal pain. Pain began a week ago and has now become diffuse and more intense. She's had subjective fever and chills. She's had decreased appetite. She denies any nausea or vomiting. She's had pain like this before but states it is not as bad. She underwent a chest x-ray in the emergency department which was suspicious for free air under the left hemidiaphragm versus a large gastric bubble. A CT scan confirmed free air and free fluid in the abdomen consistent with perforated viscus. I was asked to see her because of that. Of note is that she is hypothermic and her lactate is 7.41.  Her family is in the room.  Past Medical History:  Diagnosis Date  . Cholelithiasis   . Multiple gastric ulcers     Past Surgical History:  Procedure Laterality Date  . None      Family History  Problem Relation Age of Onset  . CAD Neg Hx    Social History:  reports that she has been smoking.  She has a 32.50 pack-year smoking history. She has never used smokeless tobacco. She reports that she does not drink alcohol or use drugs.  Allergies: No Known Allergies   Prior to Admission medications   Medication Sig Start Date End Date Taking? Authorizing Provider  ranitidine (ZANTAC) 150 MG tablet Take 150 mg by mouth 2 (two) times daily.   Yes Historical Provider, MD  metoCLOPramide (REGLAN) 10 MG tablet Take 1 tablet (10 mg total) by mouth every 6 (six) hours as needed for nausea (nausea/headache). Patient not taking: Reported on 08/26/2016 12/20/13   Riki Altes, MD  ondansetron (ZOFRAN ODT) 8 MG disintegrating tablet 92m ODT q4 hours prn nausea Patient not taking: Reported on 08/26/2016 12/20/13   JRiki Altes MD  oxyCODONE-acetaminophen (PERCOCET) 5-325 MG per  tablet Take 1 tablet by mouth every 4 (four) hours as needed. Patient not taking: Reported on 08/26/2016 12/20/13   JRiki Altes MD      (Not in a hospital admission)  Results for orders placed or performed during the hospital encounter of 08/26/16 (from the past 48 hour(s))  Lipase, blood     Status: None   Collection Time: 08/26/16  7:50 AM  Result Value Ref Range   Lipase 26 11 - 51 U/L  Comprehensive metabolic panel     Status: Abnormal   Collection Time: 08/26/16  7:50 AM  Result Value Ref Range   Sodium 137 135 - 145 mmol/L   Potassium 3.5 3.5 - 5.1 mmol/L   Chloride 98 (L) 101 - 111 mmol/L   CO2 24 22 - 32 mmol/L   Glucose, Bld 145 (H) 65 - 99 mg/dL   BUN 29 (H) 6 - 20 mg/dL   Creatinine, Ser 1.10 (H) 0.44 - 1.00 mg/dL   Calcium 10.5 (H) 8.9 - 10.3 mg/dL   Total Protein 9.0 (H) 6.5 - 8.1 g/dL   Albumin 5.1 (H) 3.5 - 5.0 g/dL   AST 35 15 - 41 U/L   ALT 17 14 - 54 U/L   Alkaline Phosphatase 50 38 - 126 U/L   Total Bilirubin 0.7 0.3 - 1.2 mg/dL   GFR calc non Af Amer 45 (L) >60 mL/min   GFR calc Af Amer 52 (L) >60 mL/min  Comment: (NOTE) The eGFR has been calculated using the CKD EPI equation. This calculation has not been validated in all clinical situations. eGFR's persistently <60 mL/min signify possible Chronic Kidney Disease.    Anion gap 15 5 - 15  CBC with Differential     Status: Abnormal   Collection Time: 08/26/16  7:50 AM  Result Value Ref Range   WBC 6.7 4.0 - 10.5 K/uL   RBC 6.19 (H) 3.87 - 5.11 MIL/uL   Hemoglobin 15.2 (H) 12.0 - 15.0 g/dL   HCT 46.2 (H) 36.0 - 46.0 %   MCV 74.6 (L) 78.0 - 100.0 fL   MCH 24.6 (L) 26.0 - 34.0 pg   MCHC 32.9 30.0 - 36.0 g/dL   RDW 16.4 (H) 11.5 - 15.5 %   Platelets 266 150 - 400 K/uL    Comment: REPEATED TO VERIFY   Neutrophils Relative % 77 %   Neutro Abs 5.1 1.7 - 7.7 K/uL   Lymphocytes Relative 16 %   Lymphs Abs 1.0 0.7 - 4.0 K/uL   Monocytes Relative 7 %   Monocytes Absolute 0.5 0.1 - 1.0 K/uL   Eosinophils  Relative 0 %   Eosinophils Absolute 0.0 0.0 - 0.7 K/uL   Basophils Relative 0 %   Basophils Absolute 0.0 0.0 - 0.1 K/uL  I-Stat CG4 Lactic Acid, ED     Status: Abnormal   Collection Time: 08/26/16  7:57 AM  Result Value Ref Range   Lactic Acid, Venous 7.41 (HH) 0.5 - 1.9 mmol/L   Comment NOTIFIED PHYSICIAN    Dg Chest 2 View  Result Date: 08/26/2016 CLINICAL DATA:  Generalized abdominal pain for the past week, migrating to the left upper abdominal quadrant for the past 2 days. History of smoking. EXAM: CHEST  2 VIEW COMPARISON:  None. FINDINGS: On the provided lateral radiograph there is a triangular early faint lucency worrisome for pneumoperitoneum. Normal cardiac silhouette and mediastinal contours. Atherosclerotic plaque when the thoracic aorta. The lungs are hyperexpanded with flattening the diaphragms. No focal airspace opacities. No pleural effusion or pneumothorax. No evidence of edema. No acute osseus abnormalities. IMPRESSION: 1. Findings worrisome for pneumoperitoneum. Further evaluation with abdominal CT is recommended. 2. Lung hyperexpansion without acute cardiopulmonary disease. 3.  Aortic Atherosclerosis (ICD10-170.0) Critical Value/emergent results were called by telephone at the time of interpretation on 08/26/2016 at 8:37 am to Dr. Marda Stalker , who verbally acknowledged these results. Electronically Signed   By: Sandi Mariscal M.D.   On: 08/26/2016 08:39   Ct Abdomen Pelvis W Contrast  Result Date: 08/26/2016 CLINICAL DATA:  Acute abdominal pain, fevers, chills, free air on chest x-ray earlier today EXAM: CT ABDOMEN AND PELVIS WITH CONTRAST TECHNIQUE: Multidetector CT imaging of the abdomen and pelvis was performed using the standard protocol following bolus administration of intravenous contrast. CONTRAST:  70m ISOVUE-300 IOPAMIDOL (ISOVUE-300) INJECTION 61% COMPARISON:  08/26/2016 FINDINGS: Lower chest: No acute abnormality. Hepatobiliary: Mild diffuse biliary prominence and  gallbladder distention, nonspecific. No focal hepatic abnormality. Hepatic and portal veins are patent. Pancreas: Pancreatic duct is visualized measuring 3 mm. No focal pancreatic abnormality or definite mass. Spleen: Normal in size without focal abnormality. Adrenals/Urinary Tract: Adrenal glands are unremarkable. Kidneys are normal, without renal calculi, focal lesion, or hydronephrosis. Bladder is unremarkable. Stomach/Bowel: Moderate amount of abdominopelvic free fluid. Fluid along the right pericolic gutter beneath the liver and gallbladder is high attenuation with 65 Hounsfield units, image 39 this is suspicious for hemoperitoneum. Diffuse pneumoperitoneum present throughout the upper abdomen  confirming the chest x-ray finding. Stomach is markedly distended. Duodenum is collapsed but has a prominent wall. Findings are compatible with perforated viscus, suspect peptic ulcer disease suggest gastric or duodenal ulcer perforation. Distal colonic diverticulosis evident. Vascular/Lymphatic: Atherosclerotic changes of the aorta and tortuosity. No occlusive process. Celiac and SMA remain patent. Calcified nodes suspected in the lower abdominal and pelvic mesentery. Reproductive: Grossly normal for age. Pelvic vascular calcifications noted. Other: No inguinal or abdominal hernia. Musculoskeletal: Degenerative changes of the spine. Bones are osteopenic. Lumbar facet arthropathy noted. Grade 1 anterolisthesis of L5 on S1 secondary to facet arthropathy. No visualized pars defects. No compression fracture. IMPRESSION: Moderate abdominopelvic free fluid. Associated upper abdominal scattered pneumoperitoneum. Pattern of fluid and free air suggest perforated viscus, suspect related to gastric or duodenal ulcer disease. Increased attenuation of the right pericolic gutter fluid suggest a component of hemoperitoneum. Other chronic findings as above These results were called by telephone at the time of interpretation on 08/26/2016  at 9:46 am to Dr. Zella Richer, who verbally acknowledged these results. Electronically Signed   By: Jerilynn Mages.  Shick M.D.   On: 08/26/2016 09:48    Review of Systems  Constitutional: Positive for chills, fever and weight loss.  Respiratory: Negative for shortness of breath.   Cardiovascular: Negative for chest pain.  Gastrointestinal: Positive for abdominal pain. Negative for blood in stool and diarrhea.    Blood pressure 150/71, pulse 64, temperature (!) 95.4 F (35.2 C), temperature source Rectal, resp. rate 18, height 5' 4" (1.626 m), weight 47.6 kg (105 lb), SpO2 96 %. Physical Exam  Constitutional:  Cachectic, elderly female who appears distressed.  HENT:  Head: Normocephalic and atraumatic.  Eyes: No scleral icterus.  Neck:  Muscular wasting is present.  Cardiovascular: Normal rate and regular rhythm.   Respiratory: Effort normal and breath sounds normal.  GI:  Abdomen is firm, distended, and diffusely tender to percussion and palpation. Diffuse guarding is present. No bowel sounds are heard. No hernias are noted.  Musculoskeletal: She exhibits no edema.  Muscular wasting is noted.  Lymphadenopathy:    She has no cervical adenopathy.  Neurological: She is alert.  She is oriented 2. She answers questions appropriately.  Skin:  Skin is cool.     Assessment/Plan 1. Perforated viscus with high suspicion being gastric or duodenal ulcer.  2. Sepsis from above. IV antibiotics have been started. Code sepsis has been initiated.  Plan: I recommended emergency exploratory laparotomy for perforated viscus.  Without this, I told her I doubt she would survive. We discussed the procedure and the risks. The risks include but are not limited to bleeding, infection, wound problems, anesthesia, need for more operations, accidental injury to intra-abdominal organs, and possibly not surviving the surgery or this illness. She and her family seemed to understand all this. She wants to proceed with the  surgery.  Odis Hollingshead, MD 08/26/2016, 10:00 AM

## 2016-08-27 LAB — CBC
HEMATOCRIT: 29.8 % — AB (ref 36.0–46.0)
HEMOGLOBIN: 10 g/dL — AB (ref 12.0–15.0)
MCH: 24.4 pg — ABNORMAL LOW (ref 26.0–34.0)
MCHC: 33.6 g/dL (ref 30.0–36.0)
MCV: 72.9 fL — ABNORMAL LOW (ref 78.0–100.0)
Platelets: 154 10*3/uL (ref 150–400)
RBC: 4.09 MIL/uL (ref 3.87–5.11)
RDW: 16.5 % — AB (ref 11.5–15.5)
WBC: 13.5 10*3/uL — AB (ref 4.0–10.5)

## 2016-08-27 LAB — BASIC METABOLIC PANEL
Anion gap: 7 (ref 5–15)
BUN: 18 mg/dL (ref 6–20)
CALCIUM: 8.3 mg/dL — AB (ref 8.9–10.3)
CO2: 24 mmol/L (ref 22–32)
Chloride: 105 mmol/L (ref 101–111)
Creatinine, Ser: 0.56 mg/dL (ref 0.44–1.00)
GFR calc non Af Amer: >60 mL/min (ref >60)
Glucose, Bld: 113 mg/dL — ABNORMAL HIGH (ref 65–99)
Potassium: 3.1 mmol/L — ABNORMAL LOW (ref 3.5–5.1)
SODIUM: 136 mmol/L (ref 135–145)

## 2016-08-27 MED ORDER — LIP MEDEX EX OINT
TOPICAL_OINTMENT | CUTANEOUS | Status: AC
  Administered 2016-08-27: 16:00:00
  Filled 2016-08-27: qty 7

## 2016-08-27 MED ORDER — SODIUM CHLORIDE 0.9 % IV SOLN
30.0000 meq | Freq: Once | INTRAVENOUS | Status: AC
  Administered 2016-08-27: 30 meq via INTRAVENOUS
  Filled 2016-08-27: qty 15

## 2016-08-27 MED ORDER — KCL IN DEXTROSE-NACL 20-5-0.9 MEQ/L-%-% IV SOLN
INTRAVENOUS | Status: DC
  Administered 2016-08-27: 75 mL via INTRAVENOUS
  Administered 2016-08-27 – 2016-08-31 (×6): via INTRAVENOUS
  Filled 2016-08-27 (×10): qty 1000

## 2016-08-27 NOTE — Progress Notes (Signed)
Assessment Active Problems:   Perforated pyloric ulcer ([place])- s/p expl lap, repair and cholecystectomy 08/26/16-pulled ng tube out.    Sepsis-significantly improved.    HTN-untreated    Delirium    Hypokalemia-replacement ordered.   Plan:  Leave ng out.  Keep NPO.  Continue IV abxs and Protonix.  Delirium and HTN per Medicine/CCM.   LOS: 1 day     1 Day Post-Op  Subjective: Confused.  Does not know where she is or why she is here.  Reports that she is not having much abdominal pain.  Objective: Vital signs in last 24 hours: Temp:  [96 F (35.6 C)-98.6 F (37 C)] 98.6 F (37 C) (12/10 0702) Pulse Rate:  [62-102] 73 (12/10 0600) Resp:  [7-19] 13 (12/10 0600) BP: (127-182)/(46-106) 135/46 (12/10 0600) SpO2:  [98 %-100 %] 100 % (12/10 0600)    Intake/Output from previous day: 12/09 0701 - 12/10 0700 In: 3590 [I.V.:3490; IV Piggyback:100] Out: 2300 [Urine:1050; Emesis/NG output:500; Blood:50] Intake/Output this shift: No intake/output data recorded.  PE: General- Agitated and disoriented. Abdomen-slightly firm, dressing dry.  Lab Results:   Recent Labs  08/26/16 0750 08/27/16 0325  WBC 6.7 13.5*  HGB 15.2* 10.0*  HCT 46.2* 29.8*  PLT 266 154   BMET  Recent Labs  08/26/16 0750 08/27/16 0325  NA 137 136  K 3.5 3.1*  CL 98* 105  CO2 24 24  GLUCOSE 145* 113*  BUN 29* 18  CREATININE 1.10* 0.56  CALCIUM 10.5* 8.3*   PT/INR No results for input(s): LABPROT, INR in the last 72 hours. Comprehensive Metabolic Panel:    Component Value Date/Time   NA 136 08/27/2016 0325   NA 137 08/26/2016 0750   K 3.1 (L) 08/27/2016 0325   K 3.5 08/26/2016 0750   CL 105 08/27/2016 0325   CL 98 (L) 08/26/2016 0750   CO2 24 08/27/2016 0325   CO2 24 08/26/2016 0750   BUN 18 08/27/2016 0325   BUN 29 (H) 08/26/2016 0750   CREATININE 0.56 08/27/2016 0325   CREATININE 1.10 (H) 08/26/2016 0750   GLUCOSE 113 (H) 08/27/2016 0325   GLUCOSE 145 (H) 08/26/2016 0750   CALCIUM 8.3 (L) 08/27/2016 0325   CALCIUM 10.5 (H) 08/26/2016 0750   AST 35 08/26/2016 0750   AST 29 12/20/2013 1034   ALT 17 08/26/2016 0750   ALT 20 12/20/2013 1034   ALKPHOS 50 08/26/2016 0750   ALKPHOS 57 12/20/2013 1034   BILITOT 0.7 08/26/2016 0750   BILITOT 0.6 12/20/2013 1034   PROT 9.0 (H) 08/26/2016 0750   PROT 8.4 (H) 12/20/2013 1034   ALBUMIN 5.1 (H) 08/26/2016 0750   ALBUMIN 4.6 12/20/2013 1034     Studies/Results: Dg Chest 2 View  Result Date: 08/26/2016 CLINICAL DATA:  Generalized abdominal pain for the past week, migrating to the left upper abdominal quadrant for the past 2 days. History of smoking. EXAM: CHEST  2 VIEW COMPARISON:  None. FINDINGS: On the provided lateral radiograph there is a triangular early faint lucency worrisome for pneumoperitoneum. Normal cardiac silhouette and mediastinal contours. Atherosclerotic plaque when the thoracic aorta. The lungs are hyperexpanded with flattening the diaphragms. No focal airspace opacities. No pleural effusion or pneumothorax. No evidence of edema. No acute osseus abnormalities. IMPRESSION: 1. Findings worrisome for pneumoperitoneum. Further evaluation with abdominal CT is recommended. 2. Lung hyperexpansion without acute cardiopulmonary disease. 3.  Aortic Atherosclerosis (ICD10-170.0) Critical Value/emergent results were called by telephone at the time of interpretation on 08/26/2016 at 8:37 am  to Dr. Marda Stalker , who verbally acknowledged these results. Electronically Signed   By: Sandi Mariscal M.D.   On: 08/26/2016 08:39   Ct Abdomen Pelvis W Contrast  Result Date: 08/26/2016 CLINICAL DATA:  Acute abdominal pain, fevers, chills, free air on chest x-ray earlier today EXAM: CT ABDOMEN AND PELVIS WITH CONTRAST TECHNIQUE: Multidetector CT imaging of the abdomen and pelvis was performed using the standard protocol following bolus administration of intravenous contrast. CONTRAST:  35m ISOVUE-300 IOPAMIDOL (ISOVUE-300)  INJECTION 61% COMPARISON:  08/26/2016 FINDINGS: Lower chest: No acute abnormality. Hepatobiliary: Mild diffuse biliary prominence and gallbladder distention, nonspecific. No focal hepatic abnormality. Hepatic and portal veins are patent. Pancreas: Pancreatic duct is visualized measuring 3 mm. No focal pancreatic abnormality or definite mass. Spleen: Normal in size without focal abnormality. Adrenals/Urinary Tract: Adrenal glands are unremarkable. Kidneys are normal, without renal calculi, focal lesion, or hydronephrosis. Bladder is unremarkable. Stomach/Bowel: Moderate amount of abdominopelvic free fluid. Fluid along the right pericolic gutter beneath the liver and gallbladder is high attenuation with 65 Hounsfield units, image 39 this is suspicious for hemoperitoneum. Diffuse pneumoperitoneum present throughout the upper abdomen confirming the chest x-ray finding. Stomach is markedly distended. Duodenum is collapsed but has a prominent wall. Findings are compatible with perforated viscus, suspect peptic ulcer disease suggest gastric or duodenal ulcer perforation. Distal colonic diverticulosis evident. Vascular/Lymphatic: Atherosclerotic changes of the aorta and tortuosity. No occlusive process. Celiac and SMA remain patent. Calcified nodes suspected in the lower abdominal and pelvic mesentery. Reproductive: Grossly normal for age. Pelvic vascular calcifications noted. Other: No inguinal or abdominal hernia. Musculoskeletal: Degenerative changes of the spine. Bones are osteopenic. Lumbar facet arthropathy noted. Grade 1 anterolisthesis of L5 on S1 secondary to facet arthropathy. No visualized pars defects. No compression fracture. IMPRESSION: Moderate abdominopelvic free fluid. Associated upper abdominal scattered pneumoperitoneum. Pattern of fluid and free air suggest perforated viscus, suspect related to gastric or duodenal ulcer disease. Increased attenuation of the right pericolic gutter fluid suggest a  component of hemoperitoneum. Other chronic findings as above These results were called by telephone at the time of interpretation on 08/26/2016 at 9:46 am to Dr. RZella Richer who verbally acknowledged these results. Electronically Signed   By: MJerilynn Mages  Shick M.D.   On: 08/26/2016 09:48    Anti-infectives: Anti-infectives    Start     Dose/Rate Route Frequency Ordered Stop   08/27/16 0800  vancomycin (VANCOCIN) 500 mg in sodium chloride 0.9 % 100 mL IVPB  Status:  Discontinued     500 mg 100 mL/hr over 60 Minutes Intravenous Every 24 hours 08/26/16 0856 08/26/16 1505   08/26/16 1600  piperacillin-tazobactam (ZOSYN) IVPB 3.375 g     3.375 g 12.5 mL/hr over 240 Minutes Intravenous Every 8 hours 08/26/16 1511     08/26/16 1400  piperacillin-tazobactam (ZOSYN) IVPB 3.375 g  Status:  Discontinued     3.375 g 12.5 mL/hr over 240 Minutes Intravenous Every 8 hours 08/26/16 0856 08/26/16 1505   08/26/16 0815  piperacillin-tazobactam (ZOSYN) IVPB 3.375 g     3.375 g 100 mL/hr over 30 Minutes Intravenous  Once 08/26/16 0807 08/26/16 0913   08/26/16 0815  vancomycin (VANCOCIN) IVPB 1000 mg/200 mL premix     1,000 mg 200 mL/hr over 60 Minutes Intravenous  Once 08/26/16 0807 08/26/16 0943       Donovan Gatchel J 08/27/2016

## 2016-08-27 NOTE — Progress Notes (Signed)
[facility] Progress Note Patient Name: Sydney Jordan DOB: 07-16-1930 MRN: [id number]   Date of Service  08/27/2016  HPI/Events of Note  Hypokalemia  eICU Interventions  Potassium replaced     Intervention Category Intermediate Interventions: Electrolyte abnormality - evaluation and management  DETERDING,ELIZABETH 08/27/2016, 6:16 AM

## 2016-08-28 ENCOUNTER — Encounter (HOSPITAL_COMMUNITY): Payer: Self-pay | Admitting: General Surgery

## 2016-08-28 DIAGNOSIS — K275 Chronic or unspecified peptic ulcer, site unspecified, with perforation: Secondary | ICD-10-CM

## 2016-08-28 LAB — GLUCOSE, CAPILLARY: GLUCOSE-CAPILLARY: 101 mg/dL — AB (ref 65–99)

## 2016-08-28 LAB — BASIC METABOLIC PANEL
Anion gap: 5 (ref 5–15)
BUN: 9 mg/dL (ref 6–20)
CHLORIDE: 107 mmol/L (ref 101–111)
CO2: 25 mmol/L (ref 22–32)
Calcium: 8.3 mg/dL — ABNORMAL LOW (ref 8.9–10.3)
Creatinine, Ser: 0.54 mg/dL (ref 0.44–1.00)
GFR calc non Af Amer: >60 mL/min (ref >60)
Glucose, Bld: 87 mg/dL (ref 65–99)
POTASSIUM: 3.1 mmol/L — AB (ref 3.5–5.1)
SODIUM: 137 mmol/L (ref 135–145)

## 2016-08-28 LAB — CBC
HCT: 25.7 % — ABNORMAL LOW (ref 36.0–46.0)
HEMOGLOBIN: 8.7 g/dL — AB (ref 12.0–15.0)
MCH: 24.5 pg — ABNORMAL LOW (ref 26.0–34.0)
MCHC: 33.9 g/dL (ref 30.0–36.0)
MCV: 72.4 fL — ABNORMAL LOW (ref 78.0–100.0)
Platelets: 138 10*3/uL — ABNORMAL LOW (ref 150–400)
RBC: 3.55 MIL/uL — AB (ref 3.87–5.11)
RDW: 16.7 % — ABNORMAL HIGH (ref 11.5–15.5)
WBC: 15.5 10*3/uL — ABNORMAL HIGH (ref 4.0–10.5)

## 2016-08-28 MED ORDER — SODIUM CHLORIDE 0.9 % IV SOLN
30.0000 meq | Freq: Once | INTRAVENOUS | Status: AC
  Administered 2016-08-28: 30 meq via INTRAVENOUS
  Filled 2016-08-28: qty 15

## 2016-08-28 MED ORDER — HYDRALAZINE HCL 20 MG/ML IJ SOLN
10.0000 mg | Freq: Four times a day (QID) | INTRAMUSCULAR | Status: DC | PRN
  Administered 2016-08-29: 10 mg via INTRAVENOUS
  Filled 2016-08-28: qty 1

## 2016-08-28 NOTE — Progress Notes (Signed)
82 y/o F admitted with perforated pyloric ulcer s/p ex-lap with primary repair.   PCCM called 12/11 am for consult for delirium and hypertension.  Assessed patient at bedside with Dr. Barkley Bruns (requesting MD).   Mild / pleasant delirium.  VSS / mild hypertension.  Pt is able to state month, DOB, son's name at bedside.  Reports mild abdominal pain.  CCS planning to assess swallowing study in am to assess for leak.  Will ask [facility] to assess for medical needs.  PCCM will be available PRN for ICU needs.     Noe Gens, NP-C  Pulmonary & Critical Care Pgr: [phone] or if no answer [phone] 08/28/2016, 11:36 AM

## 2016-08-28 NOTE — Care Management Note (Signed)
Case Management Note  Patient Details  Name: Sydney Jordan MRN: [id number] Date of Birth: November 04, 1929  Subjective/Objective:    Perforated ulcer and repair with post op hypotension                Action/Plan: Date:  August 28, 2016 Chart reviewed for concurrent status and case management needs. Will continue to follow patient progress. Discharge Planning: following for needs Expected discharge date: [id number] Velva Harman, BSN, [place], [place]  Expected Discharge Date:                  Expected Discharge Plan:  [place]  In-House Referral:  Clinical Social Work  Discharge planning Services     Post Acute Care Choice:    Choice offered to:     DME Arranged:    DME Agency:     HH Arranged:    [place] Agency:     Status of Service:  In process, will continue to follow  If discussed at Long Length of Stay Meetings, dates discussed:    Additional Comments:  Leeroy Cha, RN 08/28/2016, 10:23 AM

## 2016-08-28 NOTE — Consult Note (Signed)
Medical Consultation   Sydney Jordan  GNF:[id number]  DOB: [birth year]  DOA: 08/26/2016  PCP: No PCP Per Patient  Requesting physician: surgery  Reason for consultation: Hypertension, mild delirium   History of Present Illness: Patient is a 82 year old female with history of PUD, cholelithiasis presented with a one-week history of abdominal pain at the time of admission on 12/9. Patient had subjective fevers and chills. She underwent chest x-ray in the emergency department which was suspicious for free air under the left diaphragm. CT abdomen and pelvis showed perforated viscus. She underwent exploratory laparotomy with primary repair of the perforated pyloric ulcer and cholecystectomy on 12/9. On 12/11, [facility] internal medicine service consulted for hypertension, SBP running in the 140s-150s and patient mildly confused. On my examination, BP 143/53, son at the bedside. Patient is pleasant and cooperative, had received fentanyl at 10:22 AM for pain. She knows she is in the hospital for her "stomach and gallbladder issues", able to tell her date of birth, about her son, her home address correctly and month. She was not able to tell me then name of the hospital. Per the son, not too far from her baseline. Pain 9/10, in the epigastric region, no nausea or vomiting, fevers or chills.   Review of Systems:Positives marked in 'bold'  Constitutional: Denies fever, chills, diaphoresis, appetite change and fatigue.  HEENT: Denies photophobia, eye pain, redness, hearing loss, ear pain, congestion, sore throat, rhinorrhea, sneezing, mouth sores, trouble swallowing, neck pain, neck stiffness and tinnitus.   Respiratory: Denies SOB, DOE, cough, chest tightness,  and wheezing.   Cardiovascular: Denies chest pain, palpitations and leg swelling.  Gastrointestinal: Please see history of present illness  Genitourinary: Denies dysuria, urgency, frequency, hematuria, flank pain and difficulty urinating.   Musculoskeletal: Denies myalgias, back pain, joint swelling, arthralgias and gait problem.  Skin: Denies pallor, rash and wound.  Neurological: Denies dizziness, seizures, syncope, weakness, light-headedness, numbness and headaches.  Hematological: Denies adenopathy. Easy bruising, personal or family bleeding history  Psychiatric/Behavioral: Denies suicidal ideation, mood changes, confusion, nervousness, sleep disturbance and agitation   Allergies:  No Known Allergies    Past Medical History:  Diagnosis Date  . Cholelithiasis   . Multiple gastric ulcers     Past Surgical History:  Procedure Laterality Date  . None      Social History:  reports that she has been smoking.  She has a 32.50 pack-year smoking history. She has never used smokeless tobacco. She reports that she does not drink alcohol or use drugs.  Family History  Problem Relation Age of Onset  . CAD Neg Hx        Physical Exam: Blood pressure (!) 154/67, pulse 84, temperature 98.7 F (37.1 C), temperature source Oral, resp. rate 18, height '5\' 4"'$  (1.626 m), weight 47.6 kg (105 lb), SpO2 100 %.   Constitutional: Alert and awake, oriented x 2,  not in any acute distress, pleasant. Eyes: PERLA, EOMI, irises appear normal, anicteric sclera,  ENMT: external ears and nose appear normal, normal hearing            Lips appears normal, oropharynx mucosa, tongue, posterior pharynx appear normal  Neck: neck appears normal, no masses, normal ROM, no thyromegaly, no JVD  CVS: S1-S2 clear, no murmur rubs or gallops, no LE edema, normal pedal pulses  Respiratory:  clear to auscultation bilaterally, no wheezing, rales or rhonchi. Respiratory effort normal. No accessory muscle use.  Abdomen: soft dressing intact, nondistended  Musculoskeletal: : no cyanosis, clubbing or  edema noted bilaterally  Neuro: Cranial nerves II-XII intact, strength, sensation, reflexes Psych: judgement and insight appear normal, stable mood and  affect Skin: no rashes or lesions or ulcers, no induration or nodules    Labs Data:  Basic Metabolic Panel:  Recent Labs Lab 08/27/16 0325 08/28/16 0338  NA 136 137  K 3.1* 3.1*  CL 105 107  CO2 24 25  GLUCOSE 113* 87  BUN 18 9  CREATININE 0.56 0.54  CALCIUM 8.3* 8.3*   Liver Function Tests:  Recent Labs Lab 08/26/16 0750  AST 35  ALT 17  ALKPHOS 50  BILITOT 0.7  PROT 9.0*  ALBUMIN 5.1*    Recent Labs Lab 08/26/16 0750  LIPASE 26   No results for input(s): AMMONIA in the last 168 hours. CBC:  Recent Labs Lab 08/26/16 0750 08/27/16 0325 08/28/16 0338  WBC 6.7 13.5* 15.5*  NEUTROABS 5.1  --   --   HGB 15.2* 10.0* 8.7*  HCT 46.2* 29.8* 25.7*  MCV 74.6* 72.9* 72.4*  PLT 266 154 138*   Cardiac Enzymes: No results for input(s): CKTOTAL, CKMB, CKMBINDEX, TROPONINI in the last 168 hours. BNP: Invalid input(s): POCBNP CBG:  Recent Labs Lab 08/28/16 0018  GLUCAP 101*    Inpatient Medications:   Scheduled Meds: . pantoprazole (PROTONIX) IV  40 mg Intravenous Q12H  . piperacillin-tazobactam (ZOSYN)  IV  3.375 g Intravenous Q8H  . potassium chloride (KCL MULTIRUN) 30 mEq in 265 mL IVPB  30 mEq Intravenous Once   Continuous Infusions: . dextrose 5 % and 0.9 % NaCl with KCl 20 mEq/L 75 mL/hr at 08/28/16 1100     Radiological Exams on Admission: No results found.  Impression/Recommendations Active Problems:   Perforated ulcer ([place]), sepsis  - Per primary service, surgery   Hypertension: -  No prior history of hypertension, not on any antihypertensives outpatient  -  currently pain level is high, can be driving the elevated BP  - Placed on IV hydralazine as needed with parameters for now   Mild delirium - Per the son at the bedside, not too far from baseline, multiple factors contributing, ICU delirium, sepsis from perforated ulcer, pain and fentanyl pain medication. Dilaudid has been discontinued. - Minimize and wean sedating and pain  medications as tolerated.  - PT, OT, increase activity as tolerated   Hypokalemia - Continue potassium replacement in IV fluids and patient also has received potassium supplementation  [facility] medicine service will follow the patient, thank you for this consultation.  Time Spent on Admission: 48mns   Alize Acy M.D. [facility] 08/28/2016, 12:23 PM

## 2016-08-28 NOTE — Progress Notes (Signed)
Assessment Active Problems:   Perforated pyloric ulcer ([place])- s/p expl lap, repair and cholecystectomy 08/26/16-wound clean    Sepsis-improved    HTN-untreated    Delirium-some intermittent confusion    Hypokalemia-persists    ABL anemia   Plan:    Keep NPO.  Continue IV abxs and Protonix.  Gastrograffin UGI tomorrow.  Correct hypokalemia.  Check lab tomorrow. Stop heparin.   LOS: 2 days     2 Days Post-Op  Subjective: Confused.  Does not know where she is or why she is here.  Reports that she is not having much abdominal pain.  Objective: Vital signs in last 24 hours: Temp:  [98.5 F (36.9 C)-99.3 F (37.4 C)] 99.2 F (37.3 C) (12/11 0735) Pulse Rate:  [70-92] 82 (12/11 0800) Resp:  [13-21] 21 (12/11 0800) BP: (116-161)/(45-110) 147/65 (12/11 0800) SpO2:  [95 %-100 %] 97 % (12/11 0800)    Intake/Output from previous day: 12/10 0701 - 12/11 0700 In: 2343.8 [P.O.:480; I.V.:1713.8; IV Piggyback:150] Out: 1600 [Urine:1600] Intake/Output this shift: Total I/O In: 125 [I.V.:75; IV Piggyback:50] Out: -   PE: General- Agitated and disoriented. Abdomen-slightly firm, dressing dry.  Lab Results:   Recent Labs  08/27/16 0325 08/28/16 0338  WBC 13.5* 15.5*  HGB 10.0* 8.7*  HCT 29.8* 25.7*  PLT 154 138*   BMET  Recent Labs  08/27/16 0325 08/28/16 0338  NA 136 137  K 3.1* 3.1*  CL 105 107  CO2 24 25  GLUCOSE 113* 87  BUN 18 9  CREATININE 0.56 0.54  CALCIUM 8.3* 8.3*   PT/INR No results for input(s): LABPROT, INR in the last 72 hours. Comprehensive Metabolic Panel:    Component Value Date/Time   NA 137 08/28/2016 0338   NA 136 08/27/2016 0325   K 3.1 (L) 08/28/2016 0338   K 3.1 (L) 08/27/2016 0325   CL 107 08/28/2016 0338   CL 105 08/27/2016 0325   CO2 25 08/28/2016 0338   CO2 24 08/27/2016 0325   BUN 9 08/28/2016 0338   BUN 18 08/27/2016 0325   CREATININE 0.54 08/28/2016 0338   CREATININE 0.56 08/27/2016 0325   GLUCOSE 87 08/28/2016 0338   GLUCOSE 113 (H) 08/27/2016 0325   CALCIUM 8.3 (L) 08/28/2016 0338   CALCIUM 8.3 (L) 08/27/2016 0325   AST 35 08/26/2016 0750   AST 29 12/20/2013 1034   ALT 17 08/26/2016 0750   ALT 20 12/20/2013 1034   ALKPHOS 50 08/26/2016 0750   ALKPHOS 57 12/20/2013 1034   BILITOT 0.7 08/26/2016 0750   BILITOT 0.6 12/20/2013 1034   PROT 9.0 (H) 08/26/2016 0750   PROT 8.4 (H) 12/20/2013 1034   ALBUMIN 5.1 (H) 08/26/2016 0750   ALBUMIN 4.6 12/20/2013 1034     Studies/Results: No results found.  Anti-infectives: Anti-infectives    Start     Dose/Rate Route Frequency Ordered Stop   08/27/16 0800  vancomycin (VANCOCIN) 500 mg in sodium chloride 0.9 % 100 mL IVPB  Status:  Discontinued     500 mg 100 mL/hr over 60 Minutes Intravenous Every 24 hours 08/26/16 0856 08/26/16 1505   08/26/16 1600  piperacillin-tazobactam (ZOSYN) IVPB 3.375 g     3.375 g 12.5 mL/hr over 240 Minutes Intravenous Every 8 hours 08/26/16 1511     08/26/16 1400  piperacillin-tazobactam (ZOSYN) IVPB 3.375 g  Status:  Discontinued     3.375 g 12.5 mL/hr over 240 Minutes Intravenous Every 8 hours 08/26/16 0856 08/26/16 1505   08/26/16 0815  piperacillin-tazobactam (  ZOSYN) IVPB 3.375 g     3.375 g 100 mL/hr over 30 Minutes Intravenous  Once 08/26/16 0807 08/26/16 0913   08/26/16 0815  vancomycin (VANCOCIN) IVPB 1000 mg/200 mL premix     1,000 mg 200 mL/hr over 60 Minutes Intravenous  Once 08/26/16 0807 08/26/16 0943       Laney Bagshaw J 08/28/2016

## 2016-08-29 ENCOUNTER — Inpatient Hospital Stay (HOSPITAL_COMMUNITY): Payer: Medicare Other

## 2016-08-29 DIAGNOSIS — R410 Disorientation, unspecified: Secondary | ICD-10-CM

## 2016-08-29 DIAGNOSIS — E876 Hypokalemia: Secondary | ICD-10-CM

## 2016-08-29 LAB — BASIC METABOLIC PANEL
Anion gap: 6 (ref 5–15)
CHLORIDE: 104 mmol/L (ref 101–111)
CO2: 25 mmol/L (ref 22–32)
Calcium: 8.3 mg/dL — ABNORMAL LOW (ref 8.9–10.3)
Creatinine, Ser: 0.54 mg/dL (ref 0.44–1.00)
GFR calc Af Amer: >60 mL/min (ref >60)
GFR calc non Af Amer: >60 mL/min (ref >60)
GLUCOSE: 93 mg/dL (ref 65–99)
POTASSIUM: 3 mmol/L — AB (ref 3.5–5.1)
SODIUM: 135 mmol/L (ref 135–145)

## 2016-08-29 LAB — PHOSPHORUS: Phosphorus: 1.5 mg/dL — ABNORMAL LOW (ref 2.5–4.6)

## 2016-08-29 LAB — CBC
HCT: 26.9 % — ABNORMAL LOW (ref 36.0–46.0)
HEMOGLOBIN: 9 g/dL — AB (ref 12.0–15.0)
MCH: 24.1 pg — AB (ref 26.0–34.0)
MCHC: 33.5 g/dL (ref 30.0–36.0)
MCV: 71.9 fL — AB (ref 78.0–100.0)
Platelets: 141 10*3/uL — ABNORMAL LOW (ref 150–400)
RBC: 3.74 MIL/uL — AB (ref 3.87–5.11)
RDW: 16.7 % — ABNORMAL HIGH (ref 11.5–15.5)
WBC: 12.8 10*3/uL — ABNORMAL HIGH (ref 4.0–10.5)

## 2016-08-29 LAB — MAGNESIUM: Magnesium: 1.8 mg/dL (ref 1.7–2.4)

## 2016-08-29 MED ORDER — POTASSIUM PHOSPHATES 15 MMOLE/5ML IV SOLN
20.0000 mmol | Freq: Once | INTRAVENOUS | Status: AC
  Administered 2016-08-29: 20 mmol via INTRAVENOUS
  Filled 2016-08-29: qty 6.67

## 2016-08-29 MED ORDER — HYDRALAZINE HCL 20 MG/ML IJ SOLN: 10.0000 mg | INTRAMUSCULAR | Status: DC | PRN

## 2016-08-29 MED ORDER — IOPAMIDOL (ISOVUE-300) INJECTION 61%
INTRAVENOUS | Status: AC
  Filled 2016-08-29: qty 150

## 2016-08-29 MED ORDER — IOPAMIDOL (ISOVUE-300) INJECTION 61%
150.0000 mL | Freq: Once | INTRAVENOUS | Status: AC | PRN
Start: 2016-08-29 — End: 2016-08-29
  Administered 2016-08-29: 150 mL via INTRAVENOUS

## 2016-08-29 MED ORDER — CLONIDINE HCL 0.1 MG/24HR TD PTWK
0.1000 mg | MEDICATED_PATCH | TRANSDERMAL | Status: DC
  Administered 2016-08-29: 0.1 mg via TRANSDERMAL
  Filled 2016-08-29: qty 1

## 2016-08-29 MED ORDER — SODIUM CHLORIDE 0.9 % IV SOLN
30.0000 meq | INTRAVENOUS | Status: AC
  Administered 2016-08-29 (×2): 30 meq via INTRAVENOUS
  Filled 2016-08-29 (×2): qty 15

## 2016-08-29 MED ORDER — ENOXAPARIN SODIUM 30 MG/0.3ML ~~LOC~~ SOLN
30.0000 mg | Freq: Every day | SUBCUTANEOUS | Status: DC
  Administered 2016-08-29 – 2016-09-01 (×4): 30 mg via SUBCUTANEOUS
  Filled 2016-08-29 (×4): qty 0.3

## 2016-08-29 NOTE — Progress Notes (Signed)
Patient was up in the chair for 30 mins and went back to bed.

## 2016-08-29 NOTE — Progress Notes (Signed)
Initial Nutrition Assessment  DOCUMENTATION CODES:   Underweight  INTERVENTION:  - Diet advancement as medically feasible versus need for TF into small bowel.  - RD will follow-up 12/14.  NUTRITION DIAGNOSIS:   Inadequate oral intake related to inability to eat as evidenced by NPO status.  GOAL:   Patient will meet greater than or equal to 90% of their needs  MONITOR:   Diet advancement, Weight trends, Labs, Skin, I & O's  REASON FOR ASSESSMENT:   Diagnosis  ASSESSMENT:   82 year old female with history of symptomatic cholelithiasis and gastric ulcers presented to the emergency department with a one-week history of increasing abdominal pain. Pain began a week ago and has now become diffuse and more intense. She's had subjective fever and chills. She's had decreased appetite. She denies any nausea or vomiting. She's had pain like this before but states it is not as bad. She underwent a chest x-ray in the emergency department which was suspicious for free air under the left hemidiaphragm versus a large gastric bubble. A CT scan confirmed free air and free fluid in the abdomen consistent with perforated viscus.  Pt seen d/t dx and underweight BMI. Pt has been NPO since admission and is POD #3 ex lap with repair of perforated pyloric ulcer and cholecystectomy. NGT has been out since pt pulled it on 12/10. Unable to talk with pt today. She recently transferred from [place] to [place]. Notes indicate improving delirium and that pt continues to have abdominal pain but no N/V.  Unable to perform physical assessment but will do so at follow-up. No recent weight hx available for comparison to CBW. Will continue to monitor and document possible malnutrition at follow-up.   Per notes, pt had abdominal pain x1 week PTA which was associated with decreased appetite (and likely decreased PO intakes) during that time. If nutrition support is warranted, recommend NJ tube placement by IR, if feasible.  Pt may be refeeding/at risk for refeeding with current IVF (90 grams of dextrose), hypokalemia, and hypophosphatemia.  Medications reviewed; PRN Zofran, 40 mg IV Protonix BID, 30 mEq IV KCl x2 runs today and x1 run yesterday, 20 mmol IV KPhos x1 run today. Labs reviewed; K: 3 mmol/L, Phos: 1.5 mg/dL, BUN: <5 mg/dL, Ca:8 mg/dL.  IVF: D5-NS-20 mEq KCl @ 75 mL/hr (306 kcal).      Diet Order:  Diet NPO time specified Except for: Ice Chips  Skin:  Wound (see comment) (Abdominal incision from surgery 12/9)  Last BM:  PTA/unknown  Height:   Ht Readings from Last 1 Encounters:  08/26/16 '5\' 4"'$  (1.626 m)    Weight:   Wt Readings from Last 1 Encounters:  08/26/16 105 lb (47.6 kg)    Ideal Body Weight:  54.54 kg  BMI:  Body mass index is 18.02 kg/m.  Estimated Nutritional Needs:   Kcal:  1285-1475 (27-31 kcal/kg)  Protein:  48-57 grams (1-1.2 grams/kg)  Fluid:  >/= 1.4 L/day  EDUCATION NEEDS:   No education needs identified at this time    Jarome Matin, MS, RD, LDN, CNSC Inpatient Clinical Dietitian Pager # [phone] After hours/weekend pager # [phone]

## 2016-08-29 NOTE — Progress Notes (Signed)
Assessment Active Problems:   Perforated pyloric ulcer ([place])- s/p expl lap, repair and cholecystectomy 08/26/16-postop ileus; UGI shows no leak and some postop edema    Sepsis-resolved    HTN-untreated    Delirium-improved    Hypokalemia-persists    ABL anemia-hemoglobin stable   Plan:    Restart prophylactic heparin.  Transfer to floor.  Wait for ileus to resolve.  Check H. Pylori.  Continue IV abxs and BID PPI.  LOS: 3 days     3 Days Post-Op  Subjective: Confused.  Does not know where she is or why she is here.  Reports that she is not having much abdominal pain.  Objective: Vital signs in last 24 hours: Temp:  [97.8 F (36.6 C)-98.7 F (37.1 C)] 98.5 F (36.9 C) (12/12 0800) Pulse Rate:  [57-70] 60 (12/12 0600) Resp:  [13-20] 18 (12/12 0600) BP: (116-153)/(51-66) 144/51 (12/12 0600) SpO2:  [93 %-98 %] 98 % (12/12 0600) Last BM Date:  (prior to admission)  Intake/Output from previous day: 12/11 0701 - 12/12 0700 In: 1950 [I.V.:1800; IV Piggyback:150] Out: 2850 [Urine:2850] Intake/Output this shift: No intake/output data recorded.  PE: General- Agitated and disoriented. Abdomen-slightly firm, dressing dry.  Lab Results:   Recent Labs  08/28/16 0338 08/29/16 0332  WBC 15.5* 12.8*  HGB 8.7* 9.0*  HCT 25.7* 26.9*  PLT 138* 141*   BMET  Recent Labs  08/28/16 0338 08/29/16 0332  NA 137 135  K 3.1* 3.0*  CL 107 104  CO2 25 25  GLUCOSE 87 93  BUN 9 <5*  CREATININE 0.54 0.54  CALCIUM 8.3* 8.3*   PT/INR No results for input(s): LABPROT, INR in the last 72 hours. Comprehensive Metabolic Panel:    Component Value Date/Time   NA 135 08/29/2016 0332   NA 137 08/28/2016 0338   K 3.0 (L) 08/29/2016 0332   K 3.1 (L) 08/28/2016 0338   CL 104 08/29/2016 0332   CL 107 08/28/2016 0338   CO2 25 08/29/2016 0332   CO2 25 08/28/2016 0338   BUN <5 (L) 08/29/2016 0332   BUN 9 08/28/2016 0338   CREATININE 0.54 08/29/2016 0332   CREATININE 0.54 08/28/2016  0338   GLUCOSE 93 08/29/2016 0332   GLUCOSE 87 08/28/2016 0338   CALCIUM 8.3 (L) 08/29/2016 0332   CALCIUM 8.3 (L) 08/28/2016 0338   AST 35 08/26/2016 0750   AST 29 12/20/2013 1034   ALT 17 08/26/2016 0750   ALT 20 12/20/2013 1034   ALKPHOS 50 08/26/2016 0750   ALKPHOS 57 12/20/2013 1034   BILITOT 0.7 08/26/2016 0750   BILITOT 0.6 12/20/2013 1034   PROT 9.0 (H) 08/26/2016 0750   PROT 8.4 (H) 12/20/2013 1034   ALBUMIN 5.1 (H) 08/26/2016 0750   ALBUMIN 4.6 12/20/2013 1034     Studies/Results: Dg Ugi W/water Sol Cm  Result Date: 08/29/2016 CLINICAL DATA:  Status post exploratory laparotomy with primary repair of perforated pyloric ulcer and cholecystectomy. EXAM: WATER SOLUBLE UPPER GI SERIES TECHNIQUE: Single-column upper GI series was performed using water soluble contrast. CONTRAST:  130m ISOVUE-300 IOPAMIDOL (ISOVUE-300) INJECTION 61% COMPARISON:  CT 08/26/2016 FLUOROSCOPY TIME:  Fluoroscopy Time:  4 minutes Radiation Exposure Index (if provided by the fluoroscopic device): Number of Acquired Spot Images: 5 FINDINGS: Esophagus unremarkable. There is an 80 lung dated stomach. There is very slow emptying of the stomach. The patient was placed on her right side for 15-20 minutes. Only a small amount of contrast passed through the pylorus during this time.  The pylorus is narrowed, possibly related to postoperative edema/ swelling. No visible leak. IMPRESSION: Very slow, delayed emptying of the stomach. Pylorus is thickened and narrowed, possibly related to postoperative edema/ swelling. No visible leak. Electronically Signed   By: Rolm Baptise M.D.   On: 08/29/2016 09:57    Anti-infectives: Anti-infectives    Start     Dose/Rate Route Frequency Ordered Stop   08/27/16 0800  vancomycin (VANCOCIN) 500 mg in sodium chloride 0.9 % 100 mL IVPB  Status:  Discontinued     500 mg 100 mL/hr over 60 Minutes Intravenous Every 24 hours 08/26/16 0856 08/26/16 1505   08/26/16 1600   piperacillin-tazobactam (ZOSYN) IVPB 3.375 g     3.375 g 12.5 mL/hr over 240 Minutes Intravenous Every 8 hours 08/26/16 1511     08/26/16 1400  piperacillin-tazobactam (ZOSYN) IVPB 3.375 g  Status:  Discontinued     3.375 g 12.5 mL/hr over 240 Minutes Intravenous Every 8 hours 08/26/16 0856 08/26/16 1505   08/26/16 0815  piperacillin-tazobactam (ZOSYN) IVPB 3.375 g     3.375 g 100 mL/hr over 30 Minutes Intravenous  Once 08/26/16 0807 08/26/16 0913   08/26/16 0815  vancomycin (VANCOCIN) IVPB 1000 mg/200 mL premix     1,000 mg 200 mL/hr over 60 Minutes Intravenous  Once 08/26/16 0807 08/26/16 0943       Gracia Saggese J 08/29/2016

## 2016-08-29 NOTE — Progress Notes (Signed)
Pharmacy Antibiotic Note  Sydney Jordan is a 82 y.o. female presenting on 08/26/2016 with upper abd pain and chills x 1 week. PMH gastric ulcers and cholelithiasis.  Pharmacy has been consulted for Vancomycin and Zosyn dosing for sepsis  Day #4 antibiotics  Plan:  Zosyn 3.375 g IV q8h over 4h infusion  No dose adjustment needed - pharmacy to follow at distance  Follow length of therapy  Height: '5\' 4"'$  (162.6 cm) Weight: 105 lb (47.6 kg) IBW/kg (Calculated) : 54.7  Temp (24hrs), Avg:98.5 F (36.9 C), Min:97.8 F (36.6 C), Max:98.7 F (37.1 C)   Recent Labs Lab 08/26/16 0750 08/26/16 0757 08/26/16 1612 08/26/16 1819 08/27/16 0325 08/28/16 0338 08/29/16 0332  WBC 6.7  --   --   --  13.5* 15.5* 12.8*  CREATININE 1.10*  --   --   --  0.56 0.54 0.54  LATICACIDVEN  --  7.41* 4.4* 2.6*  --   --   --     Estimated Creatinine Clearance: 39.3 mL/min (by C-G formula based on SCr of 0.54 mg/dL).    No Known Allergies  Antimicrobials this admission: Vancomycin 12/9 >> 12/9 Zosyn 12/9 >>   Dose adjustments this admission:  Microbiology results: 12/9BCx: NGTD  Thank you for allowing pharmacy to be a part of this patient's care.  Doreene Eland, PharmD, BCPS.   Pager: [phone] 08/29/2016 10:21 AM

## 2016-08-29 NOTE — Progress Notes (Signed)
PROGRESS NOTE                                                                                                                                                                                                             Patient Demographics:    Sydney Jordan, is a 82 y.o. female, DOB - [birth year], KGU:[id number]  Admit date - 08/26/2016   Admitting Physician Avel Peace, MD  Outpatient Primary MD for the patient is No PCP Per Patient  LOS - 3                                           Follow up consult note.  Chief Complaint  Patient presents with  . Abdominal Pain       Brief Narrative   82 year old female with history of PUD, cholelithiasis, admitted for perforated viscus,She underwent exploratory laparotomy with primary repair of the perforated pyloric ulcer and cholecystectomy on 12/9. Hospitalist consulted for delirium and uncontrolled blood pressure  Subjective:    Carlean Purl today has, No headache, No chest pain,  abdominal pain is controlled, denies any shortness of breath, nausea or vomiting.    Assessment  & Plan :    Active Problems:   Perforated ulcer (HCC)    Perforated ulcer (HCC), sepsis  - Per primary service, surgery , remains nothing by mouth on IV fluid, and IV antibiotics and Protonix - Postoperative ileus, remains nothing by mouth, repeat electrolytes aggressively. - Follow on H pylori  Postoperative ileus - Management per surgery, will monitor and repeat electrolytes  Hypokalemia/hypophosphatemia - Replete, recheck in a.m.  Hypertension - No preauricular history of hypertension, not an antihypertensive medication at home - Blood pressure remains uncontrolled, currently pain level is acceptable, so will start on clonidine patch as she remains nothing by mouth, cont with prn hydralazin.  Mild delirium -  not too far from baseline, appears to be improving, multiple factors contributing, ICU delirium, sepsis from  perforated ulcer, pain and fentanyl pain medication. Dilaudid has been discontinued. - Minimize and wean sedating and pain medications as tolerated.  - PT, OT, increase activity as tolerated    Code Status : Full code  Procedures  : - s/p expl lap, repair and cholecystectomy  08/26/16  DVT Prophylaxis  :  Lovenox -   Lab Results  Component Value Date   PLT 141 (L) 08/29/2016    Antibiotics  :   Anti-infectives    Start     Dose/Rate Route Frequency Ordered Stop   08/27/16 0800  vancomycin (VANCOCIN) 500 mg in sodium chloride 0.9 % 100 mL IVPB  Status:  Discontinued     500 mg 100 mL/hr over 60 Minutes Intravenous Every 24 hours 08/26/16 0856 08/26/16 1505   08/26/16 1600  piperacillin-tazobactam (ZOSYN) IVPB 3.375 g     3.375 g 12.5 mL/hr over 240 Minutes Intravenous Every 8 hours 08/26/16 1511     08/26/16 1400  piperacillin-tazobactam (ZOSYN) IVPB 3.375 g  Status:  Discontinued     3.375 g 12.5 mL/hr over 240 Minutes Intravenous Every 8 hours 08/26/16 0856 08/26/16 1505   08/26/16 0815  piperacillin-tazobactam (ZOSYN) IVPB 3.375 g     3.375 g 100 mL/hr over 30 Minutes Intravenous  Once 08/26/16 0807 08/26/16 0913   08/26/16 0815  vancomycin (VANCOCIN) IVPB 1000 mg/200 mL premix     1,000 mg 200 mL/hr over 60 Minutes Intravenous  Once 08/26/16 0807 08/26/16 0943        Objective:   Vitals:   08/29/16 1000 08/29/16 1040 08/29/16 1100 08/29/16 1115  BP: (!) 178/77 (!) 187/69  (!) 163/69  Pulse:      Resp: 19 18 16  (!) 24  Temp:      TempSrc:      SpO2:      Weight:      Height:        Wt Readings from Last 3 Encounters:  08/26/16 47.6 kg (105 lb)  01/19/14 41.2 kg (90 lb 12.8 oz)  01/06/14 40.8 kg (90 lb)     Intake/Output Summary (Last 24 hours) at 08/29/16 1142 Last data filed at 08/29/16 1039  Gross per 24 hour  Intake             1875 ml  Output             2850 ml  Net             -975 ml     Physical Exam  Awake Alert, Oriented X 2,  pleasent Supple Neck,No JVD, Symmetrical Chest wall movement, Good air movement bilaterally, CTAB RRR,No Gallops,Rubss, No Parasternal Heave Diminished  B.Sounds, Abd Soft, No tenderness, dressing dry, No rebound - guarding or rigidity. No Cyanosis, Clubbing or edema, No new Rash or bruise      Data Review:    CBC  Recent Labs Lab 08/26/16 0750 08/27/16 0325 08/28/16 0338 08/29/16 0332  WBC 6.7 13.5* 15.5* 12.8*  HGB 15.2* 10.0* 8.7* 9.0*  HCT 46.2* 29.8* 25.7* 26.9*  PLT 266 154 138* 141*  MCV 74.6* 72.9* 72.4* 71.9*  MCH 24.6* 24.4* 24.5* 24.1*  MCHC 32.9 33.6 33.9 33.5  RDW 16.4* 16.5* 16.7* 16.7*  LYMPHSABS 1.0  --   --   --   MONOABS 0.5  --   --   --   EOSABS 0.0  --   --   --   BASOSABS 0.0  --   --   --     Chemistries   Recent Labs Lab 08/26/16 0750 08/27/16 0325 08/28/16 0338 08/29/16 0332  NA 137 136 137 135  K 3.5 3.1* 3.1* 3.0*  CL 98* 105 107 104  CO2 24 24 25 25   GLUCOSE 145* 113* 87 93  BUN 29* 18 9 <5*  CREATININE 1.10* 0.56 0.54 0.54  CALCIUM 10.5* 8.3* 8.3* 8.3*  MG  --   --   --  1.8  AST 35  --   --   --   ALT 17  --   --   --   ALKPHOS 50  --   --   --   BILITOT 0.7  --   --   --    ------------------------------------------------------------------------------------------------------------------ No results for input(s): CHOL, HDL, LDLCALC, TRIG, CHOLHDL, LDLDIRECT in the last 72 hours.  No results found for: HGBA1C ------------------------------------------------------------------------------------------------------------------ No results for input(s): TSH, T4TOTAL, T3FREE, THYROIDAB in the last 72 hours.  Invalid input(s): FREET3 ------------------------------------------------------------------------------------------------------------------ No results for input(s): VITAMINB12, FOLATE, FERRITIN, TIBC, IRON, RETICCTPCT in the last 72 hours.  Coagulation profile No results for input(s): INR, PROTIME in the last 168 hours.  No  results for input(s): DDIMER in the last 72 hours.  Cardiac Enzymes No results for input(s): CKMB, TROPONINI, MYOGLOBIN in the last 168 hours.  Invalid input(s): CK ------------------------------------------------------------------------------------------------------------------ No results found for: BNP  Inpatient Medications  Scheduled Meds: . cloNIDine  0.1 mg Transdermal Weekly  . enoxaparin (LOVENOX) injection  30 mg Subcutaneous Daily  . iopamidol      . pantoprazole (PROTONIX) IV  40 mg Intravenous Q12H  . piperacillin-tazobactam (ZOSYN)  IV  3.375 g Intravenous Q8H  . potassium chloride (KCL MULTIRUN) 30 mEq in 265 mL IVPB  30 mEq Intravenous Q3H  . potassium phosphate IVPB (mmol)  20 mmol Intravenous Once   Continuous Infusions: . dextrose 5 % and 0.9 % NaCl with KCl 20 mEq/L 75 mL/hr at 08/29/16 0338   PRN Meds:.fentaNYL (SUBLIMAZE) injection, hydrALAZINE, ondansetron **OR** ondansetron (ZOFRAN) IV  Micro Results Recent Results (from the past 240 hour(s))  Blood Culture (routine x 2)     Status: None (Preliminary result)   Collection Time: 08/26/16  8:43 AM  Result Value Ref Range Status   Specimen Description BLOOD RIGHT ARM  Final   Special Requests BOTTLES DRAWN AEROBIC AND ANAEROBIC 5CC  Final   Culture   Final    NO GROWTH 2 DAYS Performed at [facility]    Report Status PENDING  Incomplete    Radiology Reports Dg Chest 2 View  Result Date: 08/26/2016 CLINICAL DATA:  Generalized abdominal pain for the past week, migrating to the left upper abdominal quadrant for the past 2 days. History of smoking. EXAM: CHEST  2 VIEW COMPARISON:  None. FINDINGS: On the provided lateral radiograph there is a triangular early faint lucency worrisome for pneumoperitoneum. Normal cardiac silhouette and mediastinal contours. Atherosclerotic plaque when the thoracic aorta. The lungs are hyperexpanded with flattening the diaphragms. No focal airspace opacities. No pleural  effusion or pneumothorax. No evidence of edema. No acute osseus abnormalities. IMPRESSION: 1. Findings worrisome for pneumoperitoneum. Further evaluation with abdominal CT is recommended. 2. Lung hyperexpansion without acute cardiopulmonary disease. 3.  Aortic Atherosclerosis (ICD10-170.0) Critical Value/emergent results were called by telephone at the time of interpretation on 08/26/2016 at 8:37 am to Dr. Lynden Oxford , who verbally acknowledged these results. Electronically Signed   By: Simonne Come M.D.   On: 08/26/2016 08:39   Ct Abdomen Pelvis W Contrast  Result Date: 08/26/2016 CLINICAL DATA:  Acute abdominal pain, fevers, chills, free air on chest x-ray earlier today EXAM: CT ABDOMEN AND PELVIS WITH CONTRAST TECHNIQUE: Multidetector CT imaging of the abdomen and pelvis was performed using the standard protocol following bolus administration  of intravenous contrast. CONTRAST:  75mL ISOVUE-300 IOPAMIDOL (ISOVUE-300) INJECTION 61% COMPARISON:  08/26/2016 FINDINGS: Lower chest: No acute abnormality. Hepatobiliary: Mild diffuse biliary prominence and gallbladder distention, nonspecific. No focal hepatic abnormality. Hepatic and portal veins are patent. Pancreas: Pancreatic duct is visualized measuring 3 mm. No focal pancreatic abnormality or definite mass. Spleen: Normal in size without focal abnormality. Adrenals/Urinary Tract: Adrenal glands are unremarkable. Kidneys are normal, without renal calculi, focal lesion, or hydronephrosis. Bladder is unremarkable. Stomach/Bowel: Moderate amount of abdominopelvic free fluid. Fluid along the right pericolic gutter beneath the liver and gallbladder is high attenuation with 65 Hounsfield units, image 39 this is suspicious for hemoperitoneum. Diffuse pneumoperitoneum present throughout the upper abdomen confirming the chest x-ray finding. Stomach is markedly distended. Duodenum is collapsed but has a prominent wall. Findings are compatible with perforated viscus,  suspect peptic ulcer disease suggest gastric or duodenal ulcer perforation. Distal colonic diverticulosis evident. Vascular/Lymphatic: Atherosclerotic changes of the aorta and tortuosity. No occlusive process. Celiac and SMA remain patent. Calcified nodes suspected in the lower abdominal and pelvic mesentery. Reproductive: Grossly normal for age. Pelvic vascular calcifications noted. Other: No inguinal or abdominal hernia. Musculoskeletal: Degenerative changes of the spine. Bones are osteopenic. Lumbar facet arthropathy noted. Grade 1 anterolisthesis of L5 on S1 secondary to facet arthropathy. No visualized pars defects. No compression fracture. IMPRESSION: Moderate abdominopelvic free fluid. Associated upper abdominal scattered pneumoperitoneum. Pattern of fluid and free air suggest perforated viscus, suspect related to gastric or duodenal ulcer disease. Increased attenuation of the right pericolic gutter fluid suggest a component of hemoperitoneum. Other chronic findings as above These results were called by telephone at the time of interpretation on 08/26/2016 at 9:46 am to Dr. Abbey Chatters, who verbally acknowledged these results. Electronically Signed   By: Judie Petit.  Shick M.D.   On: 08/26/2016 09:48   Dg Kayleen Memos W/water Sol Cm  Result Date: 08/29/2016 CLINICAL DATA:  Status post exploratory laparotomy with primary repair of perforated pyloric ulcer and cholecystectomy. EXAM: WATER SOLUBLE UPPER GI SERIES TECHNIQUE: Single-column upper GI series was performed using water soluble contrast. CONTRAST:  ISOVUE-300 IOPAMIDOL (ISOVUE-300) INJECTION 61% COMPARISON:  CT 08/26/2016 FLUOROSCOPY TIME:  Fluoroscopy Time:  4 minutes Radiation Exposure Index (if provided by the fluoroscopic device): Number of Acquired Spot Images: 5 FINDINGS: Esophagus unremarkable. There is an 80 lung dated stomach. There is very slow emptying of the stomach. The patient was placed on her right side for 15-20 minutes. Only a small amount of  contrast passed through the pylorus during this time. The pylorus is narrowed, possibly related to postoperative edema/ swelling. No visible leak. IMPRESSION: Very slow, delayed emptying of the stomach. Pylorus is thickened and narrowed, possibly related to postoperative edema/ swelling. No visible leak. Electronically Signed   By: Charlett Nose M.D.   On: 08/29/2016 09:57     Akirah Storck M.D on 08/29/2016 at 11:42 AM  Between 7am to 7pm - Pager - [phone]  After 7pm go to [email or web address] - password [facility]  [facility] -  Office  [phone]

## 2016-08-30 DIAGNOSIS — K9189 Other postprocedural complications and disorders of digestive system: Secondary | ICD-10-CM

## 2016-08-30 DIAGNOSIS — K567 Ileus, unspecified: Secondary | ICD-10-CM

## 2016-08-30 DIAGNOSIS — I10 Essential (primary) hypertension: Secondary | ICD-10-CM

## 2016-08-30 LAB — BASIC METABOLIC PANEL
Anion gap: 6 (ref 5–15)
CHLORIDE: 109 mmol/L (ref 101–111)
CO2: 23 mmol/L (ref 22–32)
CREATININE: 0.57 mg/dL (ref 0.44–1.00)
Calcium: 8.1 mg/dL — ABNORMAL LOW (ref 8.9–10.3)
GFR calc Af Amer: >60 mL/min (ref >60)
GFR calc non Af Amer: >60 mL/min (ref >60)
Glucose, Bld: 128 mg/dL — ABNORMAL HIGH (ref 65–99)
Potassium: 3.7 mmol/L (ref 3.5–5.1)
Sodium: 138 mmol/L (ref 135–145)

## 2016-08-30 LAB — CBC
HEMATOCRIT: 31.5 % — AB (ref 36.0–46.0)
HEMOGLOBIN: 10.7 g/dL — AB (ref 12.0–15.0)
MCH: 24.3 pg — AB (ref 26.0–34.0)
MCHC: 34 g/dL (ref 30.0–36.0)
MCV: 71.4 fL — ABNORMAL LOW (ref 78.0–100.0)
Platelets: 186 10*3/uL (ref 150–400)
RBC: 4.41 MIL/uL (ref 3.87–5.11)
RDW: 16.6 % — ABNORMAL HIGH (ref 11.5–15.5)
WBC: 8.5 10*3/uL (ref 4.0–10.5)

## 2016-08-30 LAB — PHOSPHORUS: Phosphorus: 2.4 mg/dL — ABNORMAL LOW (ref 2.5–4.6)

## 2016-08-30 LAB — MAGNESIUM: Magnesium: 1.8 mg/dL (ref 1.7–2.4)

## 2016-08-30 MED ORDER — BISACODYL 10 MG RE SUPP
10.0000 mg | Freq: Once | RECTAL | Status: AC
  Administered 2016-08-30: 10 mg via RECTAL
  Filled 2016-08-30: qty 1

## 2016-08-30 MED ORDER — ZOLPIDEM TARTRATE 5 MG PO TABS: 5.0000 mg | ORAL_TABLET | Freq: Once | ORAL | Status: DC

## 2016-08-30 MED ORDER — LORAZEPAM 2 MG/ML IJ SOLN
0.5000 mg | Freq: Once | INTRAMUSCULAR | Status: AC
  Administered 2016-08-30: 0.5 mg via INTRAVENOUS
  Filled 2016-08-30: qty 1

## 2016-08-30 MED ORDER — HYDRALAZINE HCL 20 MG/ML IJ SOLN: 20.0000 mg | INTRAMUSCULAR | Status: DC | PRN

## 2016-08-30 NOTE — Progress Notes (Signed)
[facility] Progress Note  4 Days Post-Op  Subjective: No complaints, family at bedside. Denies abdominal pain. Family reports patient was nauseated all day yesterday and some overnight. Denies flatus or BM. Not getting out of bed much.  Objective: Vital signs in last 24 hours: Temp:  [97.9 F (36.6 C)-99 F (37.2 C)] 98.7 F (37.1 C) (12/13 0535) Pulse Rate:  [79-82] 82 (12/13 0535) Resp:  [14-24] 20 (12/13 0535) BP: (134-187)/(56-99) 162/99 (12/13 0535) SpO2:  [97 %-100 %] 100 % (12/13 0535) Last BM Date:  (pta )  Intake/Output from previous day: 12/12 0701 - 12/13 0700 In: 5784 [I.V.:5310; IV Piggyback:474] Out: 6644 [Urine:1220] Intake/Output this shift: No intake/output data recorded.  PE: Gen:  Alert, NAD, oriented to person - not place or time, cooperative Pulm:  unlabored Abd: Soft, NT/ND, +BS, midline incision C/D/I and granulating nicely Ext:  Muscle wasting present, No erythema, edema, or tenderness   Lab Results:   Recent Labs  08/29/16 0332 08/30/16 0350  WBC 12.8* 8.5  HGB 9.0* 10.7*  HCT 26.9* 31.5*  PLT 141* 186   BMET  Recent Labs  08/29/16 0332 08/30/16 0350  NA 135 138  K 3.0* 3.7  CL 104 109  CO2 25 23  GLUCOSE 93 128*  BUN <5* <5*  CREATININE 0.54 0.57  CALCIUM 8.3* 8.1*   PT/INR No results for input(s): LABPROT, INR in the last 72 hours. CMP     Component Value Date/Time   NA 138 08/30/2016 0350   K 3.7 08/30/2016 0350   CL 109 08/30/2016 0350   CO2 23 08/30/2016 0350   GLUCOSE 128 (H) 08/30/2016 0350   BUN <5 (L) 08/30/2016 0350   CREATININE 0.57 08/30/2016 0350   CALCIUM 8.1 (L) 08/30/2016 0350   PROT 9.0 (H) 08/26/2016 0750   ALBUMIN 5.1 (H) 08/26/2016 0750   AST 35 08/26/2016 0750   ALT 17 08/26/2016 0750   ALKPHOS 50 08/26/2016 0750   BILITOT 0.7 08/26/2016 0750   GFRNONAA >60 08/30/2016 0350   GFRAA >60 08/30/2016 0350   Lipase     Component Value Date/Time   LIPASE 26 08/26/2016 0750        Studies/Results: Dg Duanne Limerick W/water Sol Cm  Result Date: 08/29/2016 CLINICAL DATA:  Status post exploratory laparotomy with primary repair of perforated pyloric ulcer and cholecystectomy. EXAM: WATER SOLUBLE UPPER GI SERIES TECHNIQUE: Single-column upper GI series was performed using water soluble contrast. CONTRAST:  137m ISOVUE-300 IOPAMIDOL (ISOVUE-300) INJECTION 61% COMPARISON:  CT 08/26/2016 FLUOROSCOPY TIME:  Fluoroscopy Time:  4 minutes Radiation Exposure Index (if provided by the fluoroscopic device): Number of Acquired Spot Images: 5 FINDINGS: Esophagus unremarkable. There is an 80 lung dated stomach. There is very slow emptying of the stomach. The patient was placed on her right side for 15-20 minutes. Only a small amount of contrast passed through the pylorus during this time. The pylorus is narrowed, possibly related to postoperative edema/ swelling. No visible leak. IMPRESSION: Very slow, delayed emptying of the stomach. Pylorus is thickened and narrowed, possibly related to postoperative edema/ swelling. No visible leak. Electronically Signed   By: KRolm BaptiseM.D.   On: 08/29/2016 09:57    Anti-infectives: Anti-infectives    Start     Dose/Rate Route Frequency Ordered Stop   08/27/16 0800  vancomycin (VANCOCIN) 500 mg in sodium chloride 0.9 % 100 mL IVPB  Status:  Discontinued     500 mg 100 mL/hr over 60 Minutes Intravenous Every 24 hours  08/26/16 0856 08/26/16 1505   08/26/16 1600  piperacillin-tazobactam (ZOSYN) IVPB 3.375 g     3.375 g 12.5 mL/hr over 240 Minutes Intravenous Every 8 hours 08/26/16 1511     08/26/16 1400  piperacillin-tazobactam (ZOSYN) IVPB 3.375 g  Status:  Discontinued     3.375 g 12.5 mL/hr over 240 Minutes Intravenous Every 8 hours 08/26/16 0856 08/26/16 1505   08/26/16 0815  piperacillin-tazobactam (ZOSYN) IVPB 3.375 g     3.375 g 100 mL/hr over 30 Minutes Intravenous  Once 08/26/16 0807 08/26/16 0913   08/26/16 0815  vancomycin (VANCOCIN) IVPB  1000 mg/200 mL premix     1,000 mg 200 mL/hr over 60 Minutes Intravenous  Once 08/26/16 0807 08/26/16 0943       Assessment/Plan Perforated pyloric ulcer ([place])- s/p expl lap, repair and cholecystectomy 08/26/16  POD#4  UGI negative for post-operative bile leak, some post-op edema is present  Post-op ileus - await bowel function  WBC 8.5   Continue PPI, H.pylori pending   Continue IV Zosyn for now, likely transition to PO triple abx therapy for H.pylori once tolerating PO's  Sepsis - resolved HTN delirium - improved Hypokalemia - resolved, 3.7 today ABL anemia - stable   Dispo: ileus. Continue limited sips/ice chips.  Will re-check this afternoon for possible advancement of diet. D/C foley    LOS: 4 days    Jill Alexanders , [facility] Surgery 08/30/2016, 9:16 AM Pager: [phone] Consults: [phone] Mon-Fri 7:00 am-4:30 pm Sat-Sun 7:00 am-11:30 am

## 2016-08-30 NOTE — Progress Notes (Signed)
OT Cancellation Note  Patient Details Name: Sydney Jordan MRN: [id number] DOB: 1930-07-29   Cancelled Treatment:    Reason Eval/Treat Not Completed: Fatigue/lethargy limiting ability to participate. Pt's family stated that pt did not sleep well last night and that pt recently worked with PT. Will check back later as able  Britt Bottom 08/30/2016, 12:15 PM

## 2016-08-30 NOTE — Evaluation (Signed)
Physical Therapy Evaluation Patient Details Name: Sydney Jordan MRN: [id number] DOB: Feb 01, 1930 Today's Date: 08/30/2016   History of Present Illness  This is an 82 year old female with history of symptomatic cholelithiasis and gastric ulcers presented to the ED with  increasing abdominal pain, underwent exp lap for perforated viscus on 12/9, developed post op ileus;  Clinical Impression  Pt admitted with above diagnosis. Pt currently with functional limitations due to the deficits listed below (see PT Problem List).   Pt will benefit from skilled PT to increase their independence and safety with mobility to allow discharge to the venue listed below.   Pt is limited by pain and fatigue today; she is only agreeable to walk a very short distance and only does that with strong encouragement; discussed  with pt and family the importance of increasing mobility, pt being up to chair/BSC with nursing staff as much as tolerated; will continue to follow; would recommend SNF but family reports they can continue to provide 24hr assist     Follow Up Recommendations Home health PT;Supervision/Assistance - 24 hour (has 24hr assist per family)    Equipment Recommendations  None recommended by PT (pt refuses to use RW per her dtr)    Recommendations for Other Services       Precautions / Restrictions Precautions Precautions: Fall Restrictions Weight Bearing Restrictions: No      Mobility  Bed Mobility Overal bed mobility: Needs Assistance Bed Mobility: Supine to Sit     Supine to sit: Mod assist     General bed mobility comments: incr time, assist with LEs on/off bed and to bring trunk to upright  Transfers Overall transfer level: Needs assistance Equipment used: Rolling walker (2 wheeled) Transfers: Sit to/from Stand Sit to Stand: Min assist;+2 physical assistance;+2 safety/equipment         General transfer comment: heavy min +2 to transition to  standing;  Ambulation/Gait Ambulation/Gait assistance: Min assist;+2 physical assistance;+2 safety/equipment Ambulation Distance (Feet): 14 Feet (in room, pt refused to go further) Assistive device: 2 person hand held assist Gait Pattern/deviations: Step-through pattern;Shuffle;Narrow base of support;Decreased stride length        Stairs            Wheelchair Mobility    Modified Rankin (Stroke Patients Only)       Balance Overall balance assessment: Needs assistance   Sitting balance-Leahy Scale: Fair       Standing balance-Leahy Scale: Poor Standing balance comment: requires external support to maintain standing, posterior bias in standing                             Pertinent Vitals/Pain Pain Assessment: Faces Faces Pain Scale: Hurts little more Pain Location: abd Pain Descriptors / Indicators: Grimacing Pain Intervention(s): Monitored during session;Repositioned    Home Living Family/patient expects to be discharged to:: Private residence Living Arrangements: Children Available Help at Discharge: Available 24 hours/day Type of Home: House Home Access: Stairs to enter   Secretary/administrator of Steps: 2 Home Layout: One level Home Equipment: None Additional Comments: pt's family report she "teeters" around the house, needing close guarding/HHA for balance;     Prior Function Level of Independence: Independent               Hand Dominance        Extremity/Trunk Assessment   Upper Extremity Assessment Upper Extremity Assessment: Generalized weakness    Lower Extremity Assessment Lower Extremity Assessment: Generalized  weakness       Communication   Communication: No difficulties  Cognition Arousal/Alertness: Awake/alert Behavior During Therapy: WFL for tasks assessed/performed;Flat affect Overall Cognitive Status: Impaired/Different from baseline Area of Impairment: Following commands;Problem solving;Memory      Memory: Decreased short-term memory Following Commands: Follows one step commands with increased time     Problem Solving: Slow processing;Decreased initiation;Requires verbal cues;Requires tactile cues General Comments: pt interacts minimally, wants to stay in bed, most of hx obtained from pt family; she is oriented and recognizes/identifies her family members    General Comments General comments (skin integrity, edema, etc.): today pt is with posterior bias in standing/amb with PT; at baseline pt family states she tends to lean too far forward with trunk flexed and requires occasional assist  to find COG    Exercises     Assessment/Plan    PT Assessment Patient needs continued PT services  PT Problem List Decreased strength;Decreased range of motion;Decreased activity tolerance;Decreased mobility;Decreased knowledge of use of DME;Decreased balance;Pain          PT Treatment Interventions DME instruction;Gait training;Functional mobility training;Therapeutic activities;Therapeutic exercise;Patient/family education    PT Goals (Current goals can be found in the Care Plan section)  Acute Rehab PT Goals Patient Stated Goal: none stated, pt wants to be in bed PT Goal Formulation: With patient Time For Goal Achievement: 09/06/16 Potential to Achieve Goals: Fair    Frequency Min 3X/week   Barriers to discharge        Co-evaluation               End of Session Equipment Utilized During Treatment: Gait belt Activity Tolerance: Patient limited by fatigue Patient left: in bed;with call bell/phone within reach;with family/visitor present;with bed alarm set           Time: 1053-1109 PT Time Calculation (min) (ACUTE ONLY): 16 min   Charges:   PT Evaluation $PT Eval Moderate Complexity: 1 Procedure     PT G Codes:        Romanita Fager 09-14-2016, 2:18 PM

## 2016-08-30 NOTE — Progress Notes (Signed)
PROGRESS NOTE                                                                                                                                                                                                             Patient Demographics:    Sydney Jordan, is a 82 y.o. female, DOB - December 03, 1929, MGQ:[id number]  Admit date - 08/26/2016   Admitting Physician Jackolyn Confer, MD  Outpatient Primary MD for the patient is No PCP Per Patient  LOS - 4    Chief Complaint  Patient presents with  . Abdominal Pain       Brief Narrative   82 year old female with typical for disease, cholelithiasis admitted for perforated viscus, underwent exploratory laparotomy with repair of the perforated pyloric ulcer and cholecystectomy on 12/9. Postoperatively she remains nothing by mouth. Hospitalist consulted for delirium and uncontrolled hypertension.   Subjective:   Daughter and sister at bedside. Mentation appears to be at baseline. Blood pressure still elevated but improved compared to previous days.  Assessment  & Plan :   Perforated ulcer (HCC)With postoperative ileus - Per primary Team. Still has ileus and remains nothing by mouth. Continue hydration, IV fluids and PPI. On empiric Zosyn.    Hypokalemia/hypophosphatemia Replenished  Hypertension - No history of hypertension. - Blood pressure remains uncontrolled despite adequate pain control. Started on clonidine patch and when necessary hydralazine since patient is nothing by mouth. Have increased hydralazine dose. Once patient able to take by mouth we will add suitable scheduled regimen.  Mild delirium Multifactorial including ICU monitoring, sepsis, postoperative and narcotics. Now improved to baseline.  WILL continue to follow.  Code Status : Full code  Procedures  : - s/p expl lap, repair and cholecystectomy 08/26/16  DVT Prophylaxis  :  Lovenox -    Lab  Results  Component Value Date   PLT 186 08/30/2016    Antibiotics  :    Anti-infectives    Start     Dose/Rate Route Frequency Ordered Stop   08/27/16 0800  vancomycin (VANCOCIN) 500 mg in sodium chloride 0.9 % 100 mL IVPB  Status:  Discontinued     500 mg 100 mL/hr over 60 Minutes Intravenous Every 24 hours 08/26/16 0856 08/26/16 1505   08/26/16 1600  piperacillin-tazobactam (ZOSYN) IVPB 3.375 g     3.375 g 12.5 mL/hr over  240 Minutes Intravenous Every 8 hours 08/26/16 1511     08/26/16 1400  piperacillin-tazobactam (ZOSYN) IVPB 3.375 g  Status:  Discontinued     3.375 g 12.5 mL/hr over 240 Minutes Intravenous Every 8 hours 08/26/16 0856 08/26/16 1505   08/26/16 0815  piperacillin-tazobactam (ZOSYN) IVPB 3.375 g     3.375 g 100 mL/hr over 30 Minutes Intravenous  Once 08/26/16 0807 08/26/16 0913   08/26/16 0815  vancomycin (VANCOCIN) IVPB 1000 mg/200 mL premix     1,000 mg 200 mL/hr over 60 Minutes Intravenous  Once 08/26/16 0807 08/26/16 0943        Objective:   Vitals:   08/29/16 1130 08/29/16 1200 08/29/16 2106 08/30/16 0535  BP:  (!) 134/56 (!) 152/82 (!) 162/99  Pulse:  81 79 82  Resp: (!) '21 14 15 20  '$ Temp:  97.9 F (36.6 C) 99 F (37.2 C) 98.7 F (37.1 C)  TempSrc:  Oral Axillary Oral  SpO2:  97% 100% 100%  Weight:      Height:        Wt Readings from Last 3 Encounters:  08/26/16 47.6 kg (105 lb)  01/19/14 41.2 kg (90 lb 12.8 oz)  01/06/14 40.8 kg (90 lb)     Intake/Output Summary (Last 24 hours) at 08/30/16 1335 Last data filed at 08/30/16 1030  Gross per 24 hour  Intake             5509 ml  Output             1470 ml  Net             4039 ml     Physical Exam  Gen: not in distress HEENT: Mucosa, supple neck Chest: clear b/l, no added sounds CVS: N S1&S2, no murmurs, rubs or gallop GI: soft, nondistended, midline abdominal dressing, no bowel sounds Musculoskeletal: warm, no edema CNS: Alert and oriented    Data Review:    CBC  Recent  Labs Lab 08/26/16 0750 08/27/16 0325 08/28/16 0338 08/29/16 0332 08/30/16 0350  WBC 6.7 13.5* 15.5* 12.8* 8.5  HGB 15.2* 10.0* 8.7* 9.0* 10.7*  HCT 46.2* 29.8* 25.7* 26.9* 31.5*  PLT 266 154 138* 141* 186  MCV 74.6* 72.9* 72.4* 71.9* 71.4*  MCH 24.6* 24.4* 24.5* 24.1* 24.3*  MCHC 32.9 33.6 33.9 33.5 34.0  RDW 16.4* 16.5* 16.7* 16.7* 16.6*  LYMPHSABS 1.0  --   --   --   --   MONOABS 0.5  --   --   --   --   EOSABS 0.0  --   --   --   --   BASOSABS 0.0  --   --   --   --     Chemistries   Recent Labs Lab 08/26/16 0750 08/27/16 0325 08/28/16 0338 08/29/16 0332 08/30/16 0350  NA 137 136 137 135 138  K 3.5 3.1* 3.1* 3.0* 3.7  CL 98* 105 107 104 109  CO2 '24 24 25 25 23  '$ GLUCOSE 145* 113* 87 93 128*  BUN 29* 18 9 <5* <5*  CREATININE 1.10* 0.56 0.54 0.54 0.57  CALCIUM 10.5* 8.3* 8.3* 8.3* 8.1*  MG  --   --   --  1.8 1.8  AST 35  --   --   --   --   ALT 17  --   --   --   --   ALKPHOS 50  --   --   --   --  BILITOT 0.7  --   --   --   --    ------------------------------------------------------------------------------------------------------------------ No results for input(s): CHOL, HDL, LDLCALC, TRIG, CHOLHDL, LDLDIRECT in the last 72 hours.  No results found for: HGBA1C ------------------------------------------------------------------------------------------------------------------ No results for input(s): TSH, T4TOTAL, T3FREE, THYROIDAB in the last 72 hours.  Invalid input(s): FREET3 ------------------------------------------------------------------------------------------------------------------ No results for input(s): VITAMINB12, FOLATE, FERRITIN, TIBC, IRON, RETICCTPCT in the last 72 hours.  Coagulation profile No results for input(s): INR, PROTIME in the last 168 hours.  No results for input(s): DDIMER in the last 72 hours.  Cardiac Enzymes No results for input(s): CKMB, TROPONINI, MYOGLOBIN in the last 168 hours.  Invalid input(s):  CK ------------------------------------------------------------------------------------------------------------------ No results found for: BNP  Inpatient Medications  Scheduled Meds: . cloNIDine  0.1 mg Transdermal Weekly  . enoxaparin (LOVENOX) injection  30 mg Subcutaneous Daily  . pantoprazole (PROTONIX) IV  40 mg Intravenous Q12H  . piperacillin-tazobactam (ZOSYN)  IV  3.375 g Intravenous Q8H   Continuous Infusions: . dextrose 5 % and 0.9 % NaCl with KCl 20 mEq/L 75 mL/hr at 08/30/16 0934   PRN Meds:.fentaNYL (SUBLIMAZE) injection, hydrALAZINE, ondansetron **OR** ondansetron (ZOFRAN) IV  Micro Results Recent Results (from the past 240 hour(s))  Blood Culture (routine x 2)     Status: None (Preliminary result)   Collection Time: 08/26/16  8:43 AM  Result Value Ref Range Status   Specimen Description BLOOD RIGHT ARM  Final   Special Requests BOTTLES DRAWN AEROBIC AND ANAEROBIC 5CC  Final   Culture   Final    NO GROWTH 3 DAYS Performed at [facility]    Report Status PENDING  Incomplete    Radiology Reports Dg Chest 2 View  Result Date: 08/26/2016 CLINICAL DATA:  Generalized abdominal pain for the past week, migrating to the left upper abdominal quadrant for the past 2 days. History of smoking. EXAM: CHEST  2 VIEW COMPARISON:  None. FINDINGS: On the provided lateral radiograph there is a triangular early faint lucency worrisome for pneumoperitoneum. Normal cardiac silhouette and mediastinal contours. Atherosclerotic plaque when the thoracic aorta. The lungs are hyperexpanded with flattening the diaphragms. No focal airspace opacities. No pleural effusion or pneumothorax. No evidence of edema. No acute osseus abnormalities. IMPRESSION: 1. Findings worrisome for pneumoperitoneum. Further evaluation with abdominal CT is recommended. 2. Lung hyperexpansion without acute cardiopulmonary disease. 3.  Aortic Atherosclerosis (ICD10-170.0) Critical Value/emergent results were  called by telephone at the time of interpretation on 08/26/2016 at 8:37 am to Dr. Marda Stalker , who verbally acknowledged these results. Electronically Signed   By: Sandi Mariscal M.D.   On: 08/26/2016 08:39   Ct Abdomen Pelvis W Contrast  Result Date: 08/26/2016 CLINICAL DATA:  Acute abdominal pain, fevers, chills, free air on chest x-ray earlier today EXAM: CT ABDOMEN AND PELVIS WITH CONTRAST TECHNIQUE: Multidetector CT imaging of the abdomen and pelvis was performed using the standard protocol following bolus administration of intravenous contrast. CONTRAST:  51m ISOVUE-300 IOPAMIDOL (ISOVUE-300) INJECTION 61% COMPARISON:  08/26/2016 FINDINGS: Lower chest: No acute abnormality. Hepatobiliary: Mild diffuse biliary prominence and gallbladder distention, nonspecific. No focal hepatic abnormality. Hepatic and portal veins are patent. Pancreas: Pancreatic duct is visualized measuring 3 mm. No focal pancreatic abnormality or definite mass. Spleen: Normal in size without focal abnormality. Adrenals/Urinary Tract: Adrenal glands are unremarkable. Kidneys are normal, without renal calculi, focal lesion, or hydronephrosis. Bladder is unremarkable. Stomach/Bowel: Moderate amount of abdominopelvic free fluid. Fluid along the right pericolic gutter beneath the liver and gallbladder  is high attenuation with 65 Hounsfield units, image 39 this is suspicious for hemoperitoneum. Diffuse pneumoperitoneum present throughout the upper abdomen confirming the chest x-ray finding. Stomach is markedly distended. Duodenum is collapsed but has a prominent wall. Findings are compatible with perforated viscus, suspect peptic ulcer disease suggest gastric or duodenal ulcer perforation. Distal colonic diverticulosis evident. Vascular/Lymphatic: Atherosclerotic changes of the aorta and tortuosity. No occlusive process. Celiac and SMA remain patent. Calcified nodes suspected in the lower abdominal and pelvic mesentery. Reproductive:  Grossly normal for age. Pelvic vascular calcifications noted. Other: No inguinal or abdominal hernia. Musculoskeletal: Degenerative changes of the spine. Bones are osteopenic. Lumbar facet arthropathy noted. Grade 1 anterolisthesis of L5 on S1 secondary to facet arthropathy. No visualized pars defects. No compression fracture. IMPRESSION: Moderate abdominopelvic free fluid. Associated upper abdominal scattered pneumoperitoneum. Pattern of fluid and free air suggest perforated viscus, suspect related to gastric or duodenal ulcer disease. Increased attenuation of the right pericolic gutter fluid suggest a component of hemoperitoneum. Other chronic findings as above These results were called by telephone at the time of interpretation on 08/26/2016 at 9:46 am to Dr. Zella Richer, who verbally acknowledged these results. Electronically Signed   By: Jerilynn Mages.  Shick M.D.   On: 08/26/2016 09:48   Dg Duanne Limerick W/water Sol Cm  Result Date: 08/29/2016 CLINICAL DATA:  Status post exploratory laparotomy with primary repair of perforated pyloric ulcer and cholecystectomy. EXAM: WATER SOLUBLE UPPER GI SERIES TECHNIQUE: Single-column upper GI series was performed using water soluble contrast. CONTRAST:  135m ISOVUE-300 IOPAMIDOL (ISOVUE-300) INJECTION 61% COMPARISON:  CT 08/26/2016 FLUOROSCOPY TIME:  Fluoroscopy Time:  4 minutes Radiation Exposure Index (if provided by the fluoroscopic device): Number of Acquired Spot Images: 5 FINDINGS: Esophagus unremarkable. There is an 80 lung dated stomach. There is very slow emptying of the stomach. The patient was placed on her right side for 15-20 minutes. Only a small amount of contrast passed through the pylorus during this time. The pylorus is narrowed, possibly related to postoperative edema/ swelling. No visible leak. IMPRESSION: Very slow, delayed emptying of the stomach. Pylorus is thickened and narrowed, possibly related to postoperative edema/ swelling. No visible leak. Electronically  Signed   By: KRolm BaptiseM.D.   On: 08/29/2016 09:57    Time Spent in minutes  25   DLouellen MolderM.D on 08/30/2016 at 1:35 PM  Between 7am to 7pm - Pager - [phone] After 7pm go to [email or web address] - password [email or web address] -  Office  [phone]

## 2016-08-31 DIAGNOSIS — B370 Candidal stomatitis: Secondary | ICD-10-CM

## 2016-08-31 LAB — CULTURE, BLOOD (ROUTINE X 2): Culture: NO GROWTH

## 2016-08-31 LAB — H. PYLORI ANTIBODY, IGG

## 2016-08-31 MED ORDER — ADULT MULTIVITAMIN W/MINERALS CH
1.0000 | ORAL_TABLET | Freq: Every day | ORAL | Status: DC
  Administered 2016-08-31 – 2016-09-01 (×2): 1 via ORAL
  Filled 2016-08-31 (×2): qty 1

## 2016-08-31 MED ORDER — FLUCONAZOLE 100 MG PO TABS
100.0000 mg | ORAL_TABLET | Freq: Every day | ORAL | Status: DC
  Administered 2016-08-31 – 2016-09-01 (×2): 100 mg via ORAL
  Filled 2016-08-31 (×2): qty 1

## 2016-08-31 MED ORDER — ENSURE ENLIVE PO LIQD
237.0000 mL | Freq: Two times a day (BID) | ORAL | Status: DC
  Administered 2016-09-01: 237 mL via ORAL

## 2016-08-31 MED ORDER — AMLODIPINE BESYLATE 5 MG PO TABS
5.0000 mg | ORAL_TABLET | Freq: Every day | ORAL | Status: DC
  Administered 2016-08-31 – 2016-09-01 (×2): 5 mg via ORAL
  Filled 2016-08-31 (×2): qty 1

## 2016-08-31 NOTE — Progress Notes (Signed)
Assessment  Active Problems:   Perforated pyloric ulcer ([place])- s/p expl lap, repair and cholecystectomy 08/26/16-postop ileus improving    HTN-improved    Delirium-improved    ABL anemia-hemoglobin stable  Plan:  Start liquid diet.   LOS: 5 days     5 Days Post-Op  Subjective: Hungry.  Daughter in room.  Objective: Vital signs in last 24 hours: Temp:  [98.9 F (37.2 C)-99.3 F (37.4 C)] 99.3 F (37.4 C) (12/14 0530) Pulse Rate:  [67-74] 74 (12/14 0530) Resp:  [16] 16 (12/14 0530) BP: (133-156)/(62-87) 133/87 (12/14 0530) SpO2:  [96 %-99 %] 97 % (12/14 0530) Last BM Date:  (pta )  Intake/Output from previous day: 12/13 0701 - 12/14 0700 In: -  Out: 250 [Urine:250] Intake/Output this shift: No intake/output data recorded.  PE: General- In NAD Abdomen-soft, dressing dry  Lab Results:   Recent Labs  08/29/16 0332 08/30/16 0350  WBC 12.8* 8.5  HGB 9.0* 10.7*  HCT 26.9* 31.5*  PLT 141* 186   BMET  Recent Labs  08/29/16 0332 08/30/16 0350  NA 135 138  K 3.0* 3.7  CL 104 109  CO2 25 23  GLUCOSE 93 128*  BUN <5* <5*  CREATININE 0.54 0.57  CALCIUM 8.3* 8.1*   PT/INR No results for input(s): LABPROT, INR in the last 72 hours. Comprehensive Metabolic Panel:    Component Value Date/Time   NA 138 08/30/2016 0350   NA 135 08/29/2016 0332   K 3.7 08/30/2016 0350   K 3.0 (L) 08/29/2016 0332   CL 109 08/30/2016 0350   CL 104 08/29/2016 0332   CO2 23 08/30/2016 0350   CO2 25 08/29/2016 0332   BUN <5 (L) 08/30/2016 0350   BUN <5 (L) 08/29/2016 0332   CREATININE 0.57 08/30/2016 0350   CREATININE 0.54 08/29/2016 0332   GLUCOSE 128 (H) 08/30/2016 0350   GLUCOSE 93 08/29/2016 0332   CALCIUM 8.1 (L) 08/30/2016 0350   CALCIUM 8.3 (L) 08/29/2016 0332   AST 35 08/26/2016 0750   AST 29 12/20/2013 1034   ALT 17 08/26/2016 0750   ALT 20 12/20/2013 1034   ALKPHOS 50 08/26/2016 0750   ALKPHOS 57 12/20/2013 1034   BILITOT 0.7 08/26/2016 0750   BILITOT  0.6 12/20/2013 1034   PROT 9.0 (H) 08/26/2016 0750   PROT 8.4 (H) 12/20/2013 1034   ALBUMIN 5.1 (H) 08/26/2016 0750   ALBUMIN 4.6 12/20/2013 1034     Studies/Results: Dg Ugi W/water Sol Cm  Result Date: 08/29/2016 CLINICAL DATA:  Status post exploratory laparotomy with primary repair of perforated pyloric ulcer and cholecystectomy. EXAM: WATER SOLUBLE UPPER GI SERIES TECHNIQUE: Single-column upper GI series was performed using water soluble contrast. CONTRAST:  128m ISOVUE-300 IOPAMIDOL (ISOVUE-300) INJECTION 61% COMPARISON:  CT 08/26/2016 FLUOROSCOPY TIME:  Fluoroscopy Time:  4 minutes Radiation Exposure Index (if provided by the fluoroscopic device): Number of Acquired Spot Images: 5 FINDINGS: Esophagus unremarkable. There is an 80 lung dated stomach. There is very slow emptying of the stomach. The patient was placed on her right side for 15-20 minutes. Only a small amount of contrast passed through the pylorus during this time. The pylorus is narrowed, possibly related to postoperative edema/ swelling. No visible leak. IMPRESSION: Very slow, delayed emptying of the stomach. Pylorus is thickened and narrowed, possibly related to postoperative edema/ swelling. No visible leak. Electronically Signed   By: KRolm BaptiseM.D.   On: 08/29/2016 09:57    Anti-infectives: Anti-infectives    Start  Dose/Rate Route Frequency Ordered Stop   08/27/16 0800  vancomycin (VANCOCIN) 500 mg in sodium chloride 0.9 % 100 mL IVPB  Status:  Discontinued     500 mg 100 mL/hr over 60 Minutes Intravenous Every 24 hours 08/26/16 0856 08/26/16 1505   08/26/16 1600  piperacillin-tazobactam (ZOSYN) IVPB 3.375 g     3.375 g 12.5 mL/hr over 240 Minutes Intravenous Every 8 hours 08/26/16 1511     08/26/16 1400  piperacillin-tazobactam (ZOSYN) IVPB 3.375 g  Status:  Discontinued     3.375 g 12.5 mL/hr over 240 Minutes Intravenous Every 8 hours 08/26/16 0856 08/26/16 1505   08/26/16 0815  piperacillin-tazobactam  (ZOSYN) IVPB 3.375 g     3.375 g 100 mL/hr over 30 Minutes Intravenous  Once 08/26/16 0807 08/26/16 0913   08/26/16 0815  vancomycin (VANCOCIN) IVPB 1000 mg/200 mL premix     1,000 mg 200 mL/hr over 60 Minutes Intravenous  Once 08/26/16 0807 08/26/16 0943       Chaka Boyson J 08/31/2016

## 2016-08-31 NOTE — Progress Notes (Signed)
Physical Therapy Treatment Patient Details Name: THOMASENE KURZAWA MRN: [id number] DOB: 1930-08-14 Today's Date: 08/31/2016    History of Present Illness This is an 82 year old female with history of symptomatic cholelithiasis and gastric ulcers presented to the ED with  increasing abdominal pain, underwent exp lap for perforated viscus on 12/9, developed post op ileus;    PT Comments    Pt very agreeable to mobilize today and tolerated ambulation well.  Follow Up Recommendations  Home health PT;Supervision/Assistance - 24 hour     Equipment Recommendations  None recommended by PT    Recommendations for Other Services       Precautions / Restrictions Precautions Precautions: Fall    Mobility  Bed Mobility Overal bed mobility: Needs Assistance Bed Mobility: Rolling;Sidelying to Sit Rolling: Mod assist Sidelying to sit: Mod assist       General bed mobility comments: log roll technique utilized, assist for both upper and lower body  Transfers Overall transfer level: Needs assistance Equipment used: 2 person hand held assist Transfers: Sit to/from Stand Sit to Stand: Min assist         General transfer comment: heavy min assist to rise and steady, pt does not like using RW so utilized HHA - daughter assisted  Ambulation/Gait Ambulation/Gait assistance: Min assist;+2 safety/equipment Ambulation Distance (Feet): 70 Feet Assistive device: 2 person hand held assist Gait Pattern/deviations: Step-through pattern;Shuffle;Narrow base of support;Decreased stride length     General Gait Details: pt more agreeable to mobilize today, required bil UE support so utilized either 2 HHA or HHA and IV pole   Stairs            Wheelchair Mobility    Modified Rankin (Stroke Patients Only)       Balance           Standing balance support: Bilateral upper extremity supported Standing balance-Leahy Scale: Poor Standing balance comment: requires external support to  maintain standing, posterior bias in standing                    Cognition Arousal/Alertness: Awake/alert Behavior During Therapy: WFL for tasks assessed/performed Overall Cognitive Status: Impaired/Different from baseline Area of Impairment: Memory;Following commands     Memory: Decreased short-term memory Following Commands: Follows one step commands with increased time     Problem Solving: Slow processing;Decreased initiation;Requires verbal cues;Requires tactile cues      Exercises      General Comments        Pertinent Vitals/Pain Pain Assessment: No/denies pain Pain Intervention(s): Monitored during session    Home Living                      Prior Function            PT Goals (current goals can now be found in the care plan section) Progress towards PT goals: Progressing toward goals    Frequency    Min 3X/week      PT Plan Current plan remains appropriate    Co-evaluation             End of Session Equipment Utilized During Treatment: Gait belt Activity Tolerance: Patient tolerated treatment well Patient left: in bed;with call bell/phone within reach;with family/visitor present;with chair alarm set     Time: 0865-7846 PT Time Calculation (min) (ACUTE ONLY): 20 min  Charges:  $Gait Training: 8-22 mins  G Codes:      Kerrie Timm,KATHrine E 09/26/16, 4:41 PM Zenovia Jarred, PT, DPT Sep 26, 2016 Pager: [phone]

## 2016-08-31 NOTE — Evaluation (Signed)
Occupational Therapy Evaluation Patient Details Name: Sydney Jordan MRN: [id number] DOB: 10/03/1929 Today's Date: 08/31/2016    History of Present Illness This is an 82 year old female with history of symptomatic cholelithiasis and gastric ulcers presented to the ED with  increasing abdominal pain, underwent exp lap for perforated viscus on 12/9, developed post op ileus;   Clinical Impression   Pt with decline in function and safety with ADLs and ADL mobility with decreased strength, balance and endurance. Pt would benefit from acute OT services to address impairments to increase level of function and safety    Follow Up Recommendations  Home health OT;Supervision/Assistance - 24 hour    Equipment Recommendations  3 in 1 bedside commode;Tub/shower seat    Recommendations for Other Services       Precautions / Restrictions Precautions Precautions: Fall Restrictions Weight Bearing Restrictions: No      Mobility Bed Mobility               General bed mobility comments: pt on BSC upon arrival  Transfers Overall transfer level: Needs assistance Equipment used: Rolling walker (2 wheeled) Transfers: Sit to/from Stand Sit to Stand: Min assist              Balance Overall balance assessment: Needs assistance   Sitting balance-Leahy Scale: Fair       Standing balance-Leahy Scale: Poor                              ADL Overall ADL's : Needs assistance/impaired     Grooming: Wash/dry hands;Wash/dry face;Min guard;Standing   Upper Body Bathing: Min guard;Sitting   Lower Body Bathing: Moderate assistance;Sit to/from stand   Upper Body Dressing : Min guard;Sitting   Lower Body Dressing: Moderate assistance;Sit to/from stand   Toilet Transfer: Minimal assistance;BSC   Toileting- Clothing Manipulation and Hygiene: Moderate assistance;Sit to/from stand       Functional mobility during ADLs: Minimal assistance;Cueing for safety;Rolling  walker                       Pertinent Vitals/Pain Pain Assessment: No/denies pain Pain Intervention(s): Monitored during session     Hand Dominance Right   Extremity/Trunk Assessment Upper Extremity Assessment Upper Extremity Assessment: Generalized weakness   Lower Extremity Assessment Lower Extremity Assessment: Defer to PT evaluation       Communication Communication Communication: No difficulties   Cognition Arousal/Alertness: Awake/alert Behavior During Therapy: WFL for tasks assessed/performed Overall Cognitive Status: Impaired/Different from baseline Area of Impairment: Memory;Following commands     Memory: Decreased short-term memory Following Commands: Follows one step commands with increased time     Problem Solving: Slow processing;Decreased initiation;Requires verbal cues;Requires tactile cues     General Comments   pt very pleasant and cooperative                 Home Living Family/patient expects to be discharged to:: Private residence Living Arrangements: Children Available Help at Discharge: Available 24 hours/day Type of Home: House Home Access: Stairs to enter [employer] of Steps: 2   Home Layout: One level     Bathroom Shower/Tub: Tub/shower unit;Walk-in shower   Bathroom Toilet: Standard     Home Equipment: None   Additional Comments: pt's family report she "teeters" around the house, needing close guarding/HHA for balance;       Prior Functioning/Environment Level of Independence: Independent  OT Problem List: Decreased strength;Decreased activity tolerance;Decreased cognition;Impaired balance (sitting and/or standing);Decreased knowledge of use of DME or AE   OT Treatment/Interventions: Self-care/ADL training;DME and/or AE instruction;Therapeutic activities;Patient/family education    OT Goals(Current goals can be found in the care plan section) Acute Rehab OT Goals Patient Stated  Goal: go home OT Goal Formulation: With patient/family Time For Goal Achievement: 09/07/16 Potential to Achieve Goals: Good ADL Goals Pt Will Perform Grooming: with supervision;with set-up;sitting Pt Will Perform Upper Body Bathing: with supervision;with set-up;sitting Pt Will Perform Lower Body Bathing: with min assist;sitting/lateral leans;sit to/from stand Pt Will Perform Upper Body Dressing: with supervision;with set-up;sitting Pt Will Perform Lower Body Dressing: with min assist;sitting/lateral leans;sit to/from stand Pt Will Transfer to Toilet: with min guard assist;ambulating;regular height toilet;grab bars Pt Will Perform Toileting - Clothing Manipulation and hygiene: with min assist;sitting/lateral leans;sit to/from stand Pt Will Perform Tub/Shower Transfer: with min assist;with min guard assist;shower seat;3 in 1;grab bars;rolling walker;ambulating  OT Frequency: Min 2X/week   Barriers to D/C:    no barriers                     End of Session Equipment Utilized During Treatment: Gait belt;Rolling walker;Other (comment) (BSC)  Activity Tolerance: Patient tolerated treatment well Patient left: in chair;with call bell/phone within reach;with family/visitor present;with chair alarm set   Time: 8811-0315 OT Time Calculation (min): 39 min Charges:  OT General Charges $OT Visit: 1 Procedure OT Evaluation $OT Eval Moderate Complexity: 1 Procedure OT Treatments $Self Care/Home Management : 8-22 mins $Therapeutic Activity: 8-22 mins G-Codes:    Britt Bottom 08/31/2016, 11:27 AM

## 2016-08-31 NOTE — Care Management Note (Signed)
Case Management Note  Patient Details  Name: REYNALDA CANNY MRN: [id number] Date of Birth: 02/16/1930  Subjective/Objective:    82yo admitted with Perforated ulcer.                Action/Plan: From home with children who are with pt 24hr. PT recommending HHPT. Recommendations gone over with pt and daughter Jewel. Choice was offered for home health services and [facility] was chosen. 3in1 and RW also ordered. [facility] contacted for DME. No other CM needs communicated.  Expected Discharge Date:   (unknown)               Expected Discharge Plan:  [place]  In-House Referral:     Discharge planning Services  CM Consult  Post Acute Care Choice:  Home Health Choice offered to:  Adult Children  DME Arranged:  3-N-1, Walker rolling DME Agency:  [place] Arranged:  PT [place] Agency:  [place]  Status of Service:  In process, will continue to follow  If discussed at Long Length of Stay Meetings, dates discussed:    Additional CommentsLynnell Catalan, RN 08/31/2016, 1:13 PM  [phone]

## 2016-08-31 NOTE — Progress Notes (Addendum)
PROGRESS NOTE                                                                                                                                                                                                             Patient Demographics:    Sydney Jordan, is a 82 y.o. female, DOB - 08-Jun-1930, HUD:[id number]  Admit date - 08/26/2016   Admitting Physician Jackolyn Confer, MD  Outpatient Primary MD for the patient is No PCP Per Patient  LOS - 5    Chief Complaint  Patient presents with  . Abdominal Pain       Brief Narrative   82 year old female with typical for disease, cholelithiasis admitted for perforated viscus, underwent exploratory laparotomy with repair of the perforated pyloric ulcer and cholecystectomy on 12/9. Postoperatively she remains nothing by mouth. Hospitalist consulted for delirium and uncontrolled hypertension.   Subjective:   Patient had bowel movement today. Denies abdominal pain. Blood pressure stable past 24 hours.   Assessment  & Plan :   Hypertension - No history of hypertension. - Started on clonidine patch and when necessary IV hydralazine since patient was nothing by mouth.   Recommendations: -Since patient started on full liquid,  I will add amlodipine 5 mg daily and continue clonidine patch for now. She has not required when necessary IV hydralazine. -If blood pressure stable on current regimen she can be discharged on amlodipine 5 mg daily (or increased to 10 mg daily if blood pressure elevated ). Clonidine patch can be discontinued upon discharge.  Perforated ulcer (HCC)With postoperative ileus - Per primary Team.  Ileus resolving. On PPI and empiric antibiotics. Started on full liquid today.    Oral thrush Started on oral fluconazole 100 mg daily for 7 day course. (Stop date 12/21)   Hypokalemia/hypophosphatemia Replenished   Mild delirium Multifactorial including  ICU monitoring, sepsis, postoperative and narcotics. Now improved to baseline.   Will sign off. Please call for any further questions. Thank you for the consult.  Code Status : Full code  Procedures  : - s/p expl lap, repair and cholecystectomy 08/26/16  DVT Prophylaxis  :  Lovenox -    Lab Results  Component Value Date   PLT 186 08/30/2016    Antibiotics  :    Anti-infectives    Start     Dose/Rate Route Frequency  Ordered Stop   08/31/16 1000  fluconazole (DIFLUCAN) tablet 100 mg     100 mg Oral Daily 08/31/16 0940 09/07/16 0959   08/27/16 0800  vancomycin (VANCOCIN) 500 mg in sodium chloride 0.9 % 100 mL IVPB  Status:  Discontinued     500 mg 100 mL/hr over 60 Minutes Intravenous Every 24 hours 08/26/16 0856 08/26/16 1505   08/26/16 1600  piperacillin-tazobactam (ZOSYN) IVPB 3.375 g     3.375 g 12.5 mL/hr over 240 Minutes Intravenous Every 8 hours 08/26/16 1511     08/26/16 1400  piperacillin-tazobactam (ZOSYN) IVPB 3.375 g  Status:  Discontinued     3.375 g 12.5 mL/hr over 240 Minutes Intravenous Every 8 hours 08/26/16 0856 08/26/16 1505   08/26/16 0815  piperacillin-tazobactam (ZOSYN) IVPB 3.375 g     3.375 g 100 mL/hr over 30 Minutes Intravenous  Once 08/26/16 0807 08/26/16 0913   08/26/16 0815  vancomycin (VANCOCIN) IVPB 1000 mg/200 mL premix     1,000 mg 200 mL/hr over 60 Minutes Intravenous  Once 08/26/16 0807 08/26/16 0943        Objective:   Vitals:   08/30/16 0535 08/30/16 1434 08/30/16 2126 08/31/16 0530  BP: (!) 162/99 134/71 (!) 156/62 133/87  Pulse: 82 67 71 74  Resp: '20 16 16 16  '$ Temp: 98.7 F (37.1 C) 99.1 F (37.3 C) 98.9 F (37.2 C) 99.3 F (37.4 C)  TempSrc: Oral Oral Oral Oral  SpO2: 100% 99% 96% 97%  Weight:      Height:        Wt Readings from Last 3 Encounters:  08/26/16 47.6 kg (105 lb)  01/19/14 41.2 kg (90 lb 12.8 oz)  01/06/14 40.8 kg (90 lb)    No intake or output data in the 24 hours ending 08/31/16 1116   Physical  Exam  Gen: not in distress HEENT: Oral thrush, supple neck Chest: clear b/l, no added sounds CVS: N S1&S2, no murmurs, GI: soft, nondistended, midline abdominal dressing, Loudest bowel sounds Musculoskeletal: warm, no edema CNS: Alert and oriented    Data Review:    CBC  Recent Labs Lab 08/26/16 0750 08/27/16 0325 08/28/16 0338 08/29/16 0332 08/30/16 0350  WBC 6.7 13.5* 15.5* 12.8* 8.5  HGB 15.2* 10.0* 8.7* 9.0* 10.7*  HCT 46.2* 29.8* 25.7* 26.9* 31.5*  PLT 266 154 138* 141* 186  MCV 74.6* 72.9* 72.4* 71.9* 71.4*  MCH 24.6* 24.4* 24.5* 24.1* 24.3*  MCHC 32.9 33.6 33.9 33.5 34.0  RDW 16.4* 16.5* 16.7* 16.7* 16.6*  LYMPHSABS 1.0  --   --   --   --   MONOABS 0.5  --   --   --   --   EOSABS 0.0  --   --   --   --   BASOSABS 0.0  --   --   --   --     Chemistries   Recent Labs Lab 08/26/16 0750 08/27/16 0325 08/28/16 0338 08/29/16 0332 08/30/16 0350  NA 137 136 137 135 138  K 3.5 3.1* 3.1* 3.0* 3.7  CL 98* 105 107 104 109  CO2 '24 24 25 25 23  '$ GLUCOSE 145* 113* 87 93 128*  BUN 29* 18 9 <5* <5*  CREATININE 1.10* 0.56 0.54 0.54 0.57  CALCIUM 10.5* 8.3* 8.3* 8.3* 8.1*  MG  --   --   --  1.8 1.8  AST 35  --   --   --   --   ALT 17  --   --   --   --  ALKPHOS 50  --   --   --   --   BILITOT 0.7  --   --   --   --    ------------------------------------------------------------------------------------------------------------------ No results for input(s): CHOL, HDL, LDLCALC, TRIG, CHOLHDL, LDLDIRECT in the last 72 hours.  No results found for: HGBA1C ------------------------------------------------------------------------------------------------------------------ No results for input(s): TSH, T4TOTAL, T3FREE, THYROIDAB in the last 72 hours.  Invalid input(s): FREET3 ------------------------------------------------------------------------------------------------------------------ No results for input(s): VITAMINB12, FOLATE, FERRITIN, TIBC, IRON, RETICCTPCT in  the last 72 hours.  Coagulation profile No results for input(s): INR, PROTIME in the last 168 hours.  No results for input(s): DDIMER in the last 72 hours.  Cardiac Enzymes No results for input(s): CKMB, TROPONINI, MYOGLOBIN in the last 168 hours.  Invalid input(s): CK ------------------------------------------------------------------------------------------------------------------ No results found for: BNP  Inpatient Medications  Scheduled Meds: . amLODipine  5 mg Oral Daily  . cloNIDine  0.1 mg Transdermal Weekly  . enoxaparin (LOVENOX) injection  30 mg Subcutaneous Daily  . fluconazole  100 mg Oral Daily  . pantoprazole (PROTONIX) IV  40 mg Intravenous Q12H  . piperacillin-tazobactam (ZOSYN)  IV  3.375 g Intravenous Q8H   Continuous Infusions: . dextrose 5 % and 0.9 % NaCl with KCl 20 mEq/L 30 mL/hr at 08/31/16 0933   PRN Meds:.fentaNYL (SUBLIMAZE) injection, hydrALAZINE, ondansetron **OR** ondansetron (ZOFRAN) IV  Micro Results Recent Results (from the past 240 hour(s))  Blood Culture (routine x 2)     Status: None (Preliminary result)   Collection Time: 08/26/16  8:43 AM  Result Value Ref Range Status   Specimen Description BLOOD RIGHT ARM  Final   Special Requests BOTTLES DRAWN AEROBIC AND ANAEROBIC 5CC  Final   Culture   Final    NO GROWTH 4 DAYS Performed at [facility]    Report Status PENDING  Incomplete    Radiology Reports Dg Chest 2 View  Result Date: 08/26/2016 CLINICAL DATA:  Generalized abdominal pain for the past week, migrating to the left upper abdominal quadrant for the past 2 days. History of smoking. EXAM: CHEST  2 VIEW COMPARISON:  None. FINDINGS: On the provided lateral radiograph there is a triangular early faint lucency worrisome for pneumoperitoneum. Normal cardiac silhouette and mediastinal contours. Atherosclerotic plaque when the thoracic aorta. The lungs are hyperexpanded with flattening the diaphragms. No focal airspace opacities.  No pleural effusion or pneumothorax. No evidence of edema. No acute osseus abnormalities. IMPRESSION: 1. Findings worrisome for pneumoperitoneum. Further evaluation with abdominal CT is recommended. 2. Lung hyperexpansion without acute cardiopulmonary disease. 3.  Aortic Atherosclerosis (ICD10-170.0) Critical Value/emergent results were called by telephone at the time of interpretation on 08/26/2016 at 8:37 am to Dr. Marda Stalker , who verbally acknowledged these results. Electronically Signed   By: Sandi Mariscal M.D.   On: 08/26/2016 08:39   Ct Abdomen Pelvis W Contrast  Result Date: 08/26/2016 CLINICAL DATA:  Acute abdominal pain, fevers, chills, free air on chest x-ray earlier today EXAM: CT ABDOMEN AND PELVIS WITH CONTRAST TECHNIQUE: Multidetector CT imaging of the abdomen and pelvis was performed using the standard protocol following bolus administration of intravenous contrast. CONTRAST:  25m ISOVUE-300 IOPAMIDOL (ISOVUE-300) INJECTION 61% COMPARISON:  08/26/2016 FINDINGS: Lower chest: No acute abnormality. Hepatobiliary: Mild diffuse biliary prominence and gallbladder distention, nonspecific. No focal hepatic abnormality. Hepatic and portal veins are patent. Pancreas: Pancreatic duct is visualized measuring 3 mm. No focal pancreatic abnormality or definite mass. Spleen: Normal in size without focal abnormality. Adrenals/Urinary Tract: Adrenal glands are unremarkable.  Kidneys are normal, without renal calculi, focal lesion, or hydronephrosis. Bladder is unremarkable. Stomach/Bowel: Moderate amount of abdominopelvic free fluid. Fluid along the right pericolic gutter beneath the liver and gallbladder is high attenuation with 65 Hounsfield units, image 39 this is suspicious for hemoperitoneum. Diffuse pneumoperitoneum present throughout the upper abdomen confirming the chest x-ray finding. Stomach is markedly distended. Duodenum is collapsed but has a prominent wall. Findings are compatible with  perforated viscus, suspect peptic ulcer disease suggest gastric or duodenal ulcer perforation. Distal colonic diverticulosis evident. Vascular/Lymphatic: Atherosclerotic changes of the aorta and tortuosity. No occlusive process. Celiac and SMA remain patent. Calcified nodes suspected in the lower abdominal and pelvic mesentery. Reproductive: Grossly normal for age. Pelvic vascular calcifications noted. Other: No inguinal or abdominal hernia. Musculoskeletal: Degenerative changes of the spine. Bones are osteopenic. Lumbar facet arthropathy noted. Grade 1 anterolisthesis of L5 on S1 secondary to facet arthropathy. No visualized pars defects. No compression fracture. IMPRESSION: Moderate abdominopelvic free fluid. Associated upper abdominal scattered pneumoperitoneum. Pattern of fluid and free air suggest perforated viscus, suspect related to gastric or duodenal ulcer disease. Increased attenuation of the right pericolic gutter fluid suggest a component of hemoperitoneum. Other chronic findings as above These results were called by telephone at the time of interpretation on 08/26/2016 at 9:46 am to Dr. Zella Richer, who verbally acknowledged these results. Electronically Signed   By: Jerilynn Mages.  Shick M.D.   On: 08/26/2016 09:48   Dg Duanne Limerick W/water Sol Cm  Result Date: 08/29/2016 CLINICAL DATA:  Status post exploratory laparotomy with primary repair of perforated pyloric ulcer and cholecystectomy. EXAM: WATER SOLUBLE UPPER GI SERIES TECHNIQUE: Single-column upper GI series was performed using water soluble contrast. CONTRAST:  14m ISOVUE-300 IOPAMIDOL (ISOVUE-300) INJECTION 61% COMPARISON:  CT 08/26/2016 FLUOROSCOPY TIME:  Fluoroscopy Time:  4 minutes Radiation Exposure Index (if provided by the fluoroscopic device): Number of Acquired Spot Images: 5 FINDINGS: Esophagus unremarkable. There is an 80 lung dated stomach. There is very slow emptying of the stomach. The patient was placed on her right side for 15-20 minutes. Only  a small amount of contrast passed through the pylorus during this time. The pylorus is narrowed, possibly related to postoperative edema/ swelling. No visible leak. IMPRESSION: Very slow, delayed emptying of the stomach. Pylorus is thickened and narrowed, possibly related to postoperative edema/ swelling. No visible leak. Electronically Signed   By: KRolm BaptiseM.D.   On: 08/29/2016 09:57    Time Spent in minutes  25   DLouellen MolderM.D on 08/31/2016 at 11:16 AM  Between 7am to 7pm - Pager - [phone] After 7pm go to [email or web address] - password [facility] -  Office  [phone]

## 2016-08-31 NOTE — Progress Notes (Signed)
Initial Nutrition Assessment  DOCUMENTATION CODES:   Underweight, Severe malnutrition in context of chronic illness  INTERVENTION:   Monitor magnesium, potassium, and phosphorus daily for at least 3 days, MD to replete as needed, as pt is at risk for refeeding syndrome given severe malnutrition and poor PO intake x 5 days since surgery.  Provide Ensure Enlive po BID, each supplement provides 350 kcal and 20 grams of protein Multivitamin with minerals daily Encourage PO intake RD to continue to monitor  NUTRITION DIAGNOSIS:   Inadequate oral intake related to inability to eat as evidenced by NPO status.  Now on full liquid diet.  GOAL:   Patient will meet greater than or equal to 90% of their needs  Not meeting.  MONITOR:   PO intake, Supplement acceptance, Labs, Weight trends, Skin, I & O's  ASSESSMENT:   82 year old female with history of symptomatic cholelithiasis and gastric ulcers presented to the emergency department with a one-week history of increasing abdominal pain. Pain began a week ago and has now become diffuse and more intense. She's had subjective fever and chills. She's had decreased appetite. She denies any nausea or vomiting. She's had pain like this before but states it is not as bad. She underwent a chest x-ray in the emergency department which was suspicious for free air under the left hemidiaphragm versus a large gastric bubble. A CT scan confirmed free air and free fluid in the abdomen consistent with perforated viscus. 12/9: s/p expl lap, repair and cholecystectomy   Patient in room with daughter and RN at bedside. Pt's daughter states pt hasn't been eating well today. Only had some pudding and orange juice. Per RN, pt stated she was hungry this morning. Pt drinks ensure supplements at home. RD will order Ensure supplements and pt agreed to sip on them today. Pt states she is eager for some solid foods.   Nutrition-Focused physical exam completed. Findings  are severe fat depletion, severe muscle depletion, and no edema.  Pt with severe malnutrition and poor PO. Pt is at risk of refeeding syndrome. Will order multivitamin.  Medications: IV Protonix every 12 hours, D5 and .9% NaCl w/ KCl infusion at 30 ml/hr, IV Zofran PRN Labs reviewed: Low Phos Mg/Phos  Diet Order:  Diet full liquid Room service appropriate? Yes; Fluid consistency: Thin  Skin:  Wound (see comment) (Abdominal incision from surgery 12/9)  Last BM:  12/14  Height:   Ht Readings from Last 1 Encounters:  08/26/16 '5\' 4"'$  (1.626 m)    Weight:   Wt Readings from Last 1 Encounters:  08/26/16 105 lb (47.6 kg)    Ideal Body Weight:  54.54 kg  BMI:  Body mass index is 18.02 kg/m.  Estimated Nutritional Needs:   Kcal:  1285-1475 (27-31 kcal/kg)  Protein:  48-57 grams (1-1.2 grams/kg)  Fluid:  >/= 1.4 L/day  EDUCATION NEEDS:   No education needs identified at this time  Clayton Bibles, MS, RD, LDN Pager: [phone] After Hours Pager: [phone]

## 2016-08-31 NOTE — Care Management Important Message (Signed)
Important Message  Patient Details  Name: Sydney Jordan MRN: [id number] Date of Birth: [birth year]   Medicare Important Message Given:  Yes    Camillo Flaming 08/31/2016, 10:52 AMImportant Message  Patient Details  Name: Sydney Jordan MRN: [id number] Date of Birth: May 10, 1930   Medicare Important Message Given:  Yes    Camillo Flaming 08/31/2016, 10:52 AM

## 2016-09-01 DIAGNOSIS — K8010 Calculus of gallbladder with chronic cholecystitis without obstruction: Secondary | ICD-10-CM

## 2016-09-01 MED ORDER — AMLODIPINE BESYLATE 5 MG PO TABS
5.0000 mg | ORAL_TABLET | Freq: Every day | ORAL | 1 refills | Status: DC
Start: 2016-09-01 — End: 2019-10-18

## 2016-09-01 MED ORDER — AMOXICILLIN 500 MG PO CAPS: 1000.0000 mg | ORAL_CAPSULE | Freq: Two times a day (BID) | ORAL | 0 refills | Status: AC

## 2016-09-01 MED ORDER — AMOXICILLIN 250 MG PO CAPS
1000.0000 mg | ORAL_CAPSULE | Freq: Two times a day (BID) | ORAL | Status: DC
  Administered 2016-09-01: 1000 mg via ORAL
  Filled 2016-09-01: qty 4

## 2016-09-01 MED ORDER — FLUCONAZOLE 100 MG PO TABS
100.0000 mg | ORAL_TABLET | Freq: Every day | ORAL | 0 refills | Status: AC
Start: 2016-09-01 — End: 2016-09-07

## 2016-09-01 MED ORDER — CLARITHROMYCIN 500 MG PO TABS: 500.0000 mg | ORAL_TABLET | Freq: Two times a day (BID) | ORAL | 0 refills | Status: AC

## 2016-09-01 MED ORDER — DEXTROSE 5 % IV SOLN
20.0000 meq | Freq: Once | INTRAVENOUS | Status: AC
  Administered 2016-09-01: 20 meq via INTRAVENOUS
  Filled 2016-09-01: qty 4.55

## 2016-09-01 MED ORDER — CLARITHROMYCIN 500 MG PO TABS
500.0000 mg | ORAL_TABLET | Freq: Two times a day (BID) | ORAL | Status: DC
  Administered 2016-09-01: 500 mg via ORAL
  Filled 2016-09-01 (×3): qty 1

## 2016-09-01 MED ORDER — ENSURE ENLIVE PO LIQD: 237.0000 mL | Freq: Two times a day (BID) | ORAL | 30 refills | Status: AC

## 2016-09-01 MED ORDER — PANTOPRAZOLE SODIUM 40 MG PO TBEC: 40.0000 mg | DELAYED_RELEASE_TABLET | Freq: Two times a day (BID) | ORAL | 0 refills | Status: DC

## 2016-09-01 MED ORDER — PANTOPRAZOLE SODIUM 40 MG PO TBEC
40.0000 mg | DELAYED_RELEASE_TABLET | Freq: Two times a day (BID) | ORAL | Status: DC
  Administered 2016-09-01: 40 mg via ORAL
  Filled 2016-09-01: qty 1

## 2016-09-01 MED ORDER — SACCHAROMYCES BOULARDII 250 MG PO CAPS
250.0000 mg | ORAL_CAPSULE | Freq: Two times a day (BID) | ORAL | Status: DC
  Administered 2016-09-01: 250 mg via ORAL
  Filled 2016-09-01: qty 1

## 2016-09-01 MED ORDER — SACCHAROMYCES BOULARDII 250 MG PO CAPS: 250.0000 mg | ORAL_CAPSULE | Freq: Two times a day (BID) | ORAL | Status: DC

## 2016-09-01 MED ORDER — ADULT MULTIVITAMIN W/MINERALS CH: 1.0000 | ORAL_TABLET | Freq: Every day | ORAL | Status: DC

## 2016-09-01 NOTE — Progress Notes (Signed)
Patient ID: Sydney Jordan, female   DOB: 11/20/1929, 82 y.o.   MRN: [id number]  [facility] Surgery Progress Note  6 Days Post-Op  Subjective: Feeling well today. Tolerating full liquids. BM yesterday. Denies any current abdominal pain, nausea, or vomiting.   Objective: Vital signs in last 24 hours: Temp:  [98.4 F (36.9 C)-98.8 F (37.1 C)] 98.8 F (37.1 C) (12/15 0530) Pulse Rate:  [66-82] 66 (12/15 0530) Resp:  [14-16] 16 (12/15 0530) BP: (122-143)/(55-80) 143/68 (12/15 0530) SpO2:  [96 %-99 %] 97 % (12/15 0530) Last BM Date: 08/31/16  Intake/Output from previous day: 12/14 0701 - 12/15 0700 In: 480 [P.O.:480] Out: 120 [Urine:120] Intake/Output this shift: No intake/output data recorded.  PE: Gen:  Alert, NAD, pleasant Card:  RRR, no M/G/R heard Pulm:  CTAB, no W/R/R Abd: Soft, NT/ND, +BS, open midline incision clean and healing well with no drainage or surrounding erythema Ext:  No erythema, edema, or tenderness   Lab Results:   Recent Labs  08/30/16 0350  WBC 8.5  HGB 10.7*  HCT 31.5*  PLT 186   BMET  Recent Labs  08/30/16 0350  NA 138  K 3.7  CL 109  CO2 23  GLUCOSE 128*  BUN <5*  CREATININE 0.57  CALCIUM 8.1*   PT/INR No results for input(s): LABPROT, INR in the last 72 hours. CMP     Component Value Date/Time   NA 138 08/30/2016 0350   K 3.7 08/30/2016 0350   CL 109 08/30/2016 0350   CO2 23 08/30/2016 0350   GLUCOSE 128 (H) 08/30/2016 0350   BUN <5 (L) 08/30/2016 0350   CREATININE 0.57 08/30/2016 0350   CALCIUM 8.1 (L) 08/30/2016 0350   PROT 9.0 (H) 08/26/2016 0750   ALBUMIN 5.1 (H) 08/26/2016 0750   AST 35 08/26/2016 0750   ALT 17 08/26/2016 0750   ALKPHOS 50 08/26/2016 0750   BILITOT 0.7 08/26/2016 0750   GFRNONAA >60 08/30/2016 0350   GFRAA >60 08/30/2016 0350   Lipase     Component Value Date/Time   LIPASE 26 08/26/2016 0750       Studies/Results: No results found.  Anti-infectives: Anti-infectives    Start     Dose/Rate Route Frequency Ordered Stop   08/31/16 1000  fluconazole (DIFLUCAN) tablet 100 mg     100 mg Oral Daily 08/31/16 0940 09/07/16 0959   08/27/16 0800  vancomycin (VANCOCIN) 500 mg in sodium chloride 0.9 % 100 mL IVPB  Status:  Discontinued     500 mg 100 mL/hr over 60 Minutes Intravenous Every 24 hours 08/26/16 0856 08/26/16 1505   08/26/16 1600  piperacillin-tazobactam (ZOSYN) IVPB 3.375 g     3.375 g 12.5 mL/hr over 240 Minutes Intravenous Every 8 hours 08/26/16 1511     08/26/16 1400  piperacillin-tazobactam (ZOSYN) IVPB 3.375 g  Status:  Discontinued     3.375 g 12.5 mL/hr over 240 Minutes Intravenous Every 8 hours 08/26/16 0856 08/26/16 1505   08/26/16 0815  piperacillin-tazobactam (ZOSYN) IVPB 3.375 g     3.375 g 100 mL/hr over 30 Minutes Intravenous  Once 08/26/16 0807 08/26/16 0913   08/26/16 0815  vancomycin (VANCOCIN) IVPB 1000 mg/200 mL premix     1,000 mg 200 mL/hr over 60 Minutes Intravenous  Once 08/26/16 0962 08/26/16 8366       Assessment/Plan Emergency exploratory laparotomy, primary repair of perforated pyloric ulcer with lesser omentum patch reinforcement, cholecystectomy 12/9 Dr. Zella Richer - POD 6 - ileus resolved  HTN -  amlodipine '5mg'$  daily, clonidine patch. Per medicine "If blood pressure stable on current regimen she can be discharged on amlodipine 5 mg daily (or increased to 10 mg daily if blood pressure elevated ). Clonidine patch can be discontinued upon discharge."  Oral thrush - Started on oral fluconazole 100 mg daily for 7 day course. (Stop date 12/21)  Delirium - improved  H pylori - antibody positive. Continue PPI, will d/c zosyn and start on clarithromycin/amoxicillin for 14 days  FEN - soft diet VTE - lovenox  Plan - advance to soft diet. D/c zosyn and start amoxicillin/clarythromycin for H pylori treatment. D/c clonidine patch today and monitor BP; continue amlodipine '5mg'$ . Will recheck this afternoon for possible discharge.  She will be sent home with PPI, amoxicillin, clarithromycin, amlodipine 5-'10mg'$  (depending on BP today), oral fluconazole. She has home health arranged for PT and RN for dressing changes. She will follow-up with Dr. Zella Richer in about 2 weeks.   LOS: 6 days    Jerrye Beavers , [facility] Surgery 09/01/2016, 7:44 AM Pager: [phone] Consults: [phone] Mon-Fri 7:00 am-4:30 pm Sat-Sun 7:00 am-11:30 am

## 2016-09-01 NOTE — Discharge Instructions (Signed)
MIDLINE WOUND CARE: - midline dressing to be changed twice daily - supplies: sterile saline, kerlix, scissors, ABD pads, tape  - remove dressing and all packing carefully, moistening with sterile saline as needed to avoid packing/internal dressing sticking to the wound. - clean edges of skin around the wound with water/gauze, making sure there is no tape debris or leakage left on skin that could cause skin irritation or breakdown. - dampen and clean kerlix with sterile saline and pack wound from wound base to skin level, making sure to take note of any possible areas of wound tracking, tunneling and packing appropriately. Wound can be packed loosely. Trim kerlix to size if a whole kerlix is not required. - cover wound with a dry ABD pad and secure with tape.  - write the date/time on the dry dressing/tape to better track when the last dressing change occurred. - apply any skin protectant/powder recommended by clinician to protect skin/skin folds. - change dressing as needed if leakage occurs, wound gets contaminated, or patient requests to shower. - patient may shower daily with wound open and following the shower the wound should be dried and a clean dressing placed.    [facility], [place] [phone]  OPEN ABDOMINAL SURGERY: POST OP INSTRUCTIONS  Always review your discharge instruction sheet given to you by the facility where your surgery was performed.  IF YOU HAVE DISABILITY OR FAMILY LEAVE FORMS, YOU MUST BRING THEM TO THE OFFICE FOR PROCESSING.  PLEASE DO NOT GIVE THEM TO YOUR DOCTOR.  1. A prescription for pain medication may be given to you upon discharge.  Take your pain medication as prescribed, if needed.  If narcotic pain medicine is not needed, then you may take acetaminophen (Tylenol) or ibuprofen (Advil) as needed. 2. Take your usually prescribed medications unless otherwise directed. 3. If you need a refill on your pain medication, please contact your  pharmacy. They will contact our office to request authorization.  Prescriptions will not be filled after 5pm or on week-ends. 4. You should follow a light diet the first few days after arrival home, such as soup and crackers, pudding, etc.unless your doctor has advised otherwise. A high-fiber, low fat diet can be resumed as tolerated.   Be sure to include lots of fluids daily. Most patients will experience some swelling and bruising on the chest and neck area.  Ice packs will help.  Swelling and bruising can take several days to resolve 5. Most patients will experience some swelling and bruising in the area of the incision. Ice pack will help. Swelling and bruising can take several days to resolve..  6. It is common to experience some constipation if taking pain medication after surgery.  Increasing fluid intake and taking a stool softener will usually help or prevent this problem from occurring.  A mild laxative (Milk of Magnesia or Miralax) should be taken according to package directions if there are no bowel movements after 48 hours. 7.  Wound care instructions above 8. ACTIVITIES:  You may resume regular (light) daily activities beginning the next day--such as daily self-care, walking, climbing stairs--gradually increasing activities as tolerated.  You may have sexual intercourse when it is comfortable.  Refrain from any heavy lifting or straining until approved by your doctor. a. You may drive when you no longer are taking prescription pain medication, you can comfortably wear a seatbelt, and you can safely maneuver your car and apply brakes 9. You should see your doctor  in the office for a follow-up appointment approximately two weeks after your surgery.  Make sure that you call for this appointment within a day or two after you arrive home to insure a convenient appointment time.   WHEN TO CALL YOUR DOCTOR: 1. Fever over 101.0 2. Inability to urinate 3. Nausea and/or vomiting 4. Extreme swelling  or bruising 5. Continued bleeding from incision. 6. Increased pain, redness, or drainage from the incision. 7. Difficulty swallowing or breathing 8. Muscle cramping or spasms. 9. Numbness or tingling in hands or feet or around lips.  The clinic staff is available to answer your questions during regular business hours.  Please dont hesitate to call and ask to speak to one of the nurses if you have concerns.  For further questions, please visit [email or web address]

## 2016-09-01 NOTE — Progress Notes (Signed)
Occupational Therapy Treatment Patient Details Name: Sydney Jordan MRN: [id number] DOB: 30-May-1930 Today's Date: 09/01/2016    History of present illness This is an 82 year old female with history of symptomatic cholelithiasis and gastric ulcers presented to the ED with  increasing abdominal pain, underwent exp lap for perforated viscus on 12/9, developed post op ileus;   OT comments  Pt making progress with functional goals. MD in to see pt during session and may d/c pt home this afternoon.   Follow Up Recommendations  Home health OT;Supervision/Assistance - 24 hour    Equipment Recommendations  3 in 1 bedside commode;Tub/shower seat    Recommendations for Other Services      Precautions / Restrictions Precautions Precautions: Fall Restrictions Weight Bearing Restrictions: No       Mobility Bed Mobility               General bed mobility comments: pt up in recliner  Transfers Overall transfer level: Needs assistance Equipment used: Rolling walker (2 wheeled) Transfers: Sit to/from Stand Sit to Stand: Min assist              Balance Overall balance assessment: Needs assistance   Sitting balance-Leahy Scale: Fair       Standing balance-Leahy Scale: Poor                     ADL       Grooming: Wash/dry hands;Wash/dry face;Min guard;Standing   Upper Body Bathing: Set up;Supervision/ safety;Sitting   Lower Body Bathing: Moderate assistance;Sit to/from stand   Upper Body Dressing : Supervision/safety;Set up;Sitting   Lower Body Dressing: Moderate assistance;Sit to/from stand   Toilet Transfer: Minimal assistance;RW;Ambulation;Comfort height toilet;Grab bars   Toileting- Clothing Manipulation and Hygiene: Sit to/from stand;Minimal assistance;Min guard       Functional mobility during ADLs: Minimal assistance;Cueing for safety;Rolling walker                                        Cognition   Behavior During  Therapy: WFL for tasks assessed/performed Overall Cognitive Status: Impaired/Different from baseline Area of Impairment: Memory;Following commands     Memory: Decreased short-term memory  Following Commands: Follows one step commands with increased time     Problem Solving: Slow processing;Decreased initiation;Requires verbal cues;Requires tactile cues      Extremity/Trunk Assessment   generalized weakness                        General Comments  pt very pleasant and cooperative    Pertinent Vitals/ Pain       Pain Assessment: No/denies pain Pain Intervention(s): Monitored during session                                                          Frequency  Min 2X/week        Progress Toward Goals  OT Goals(current goals can now be found in the care plan section)  Progress towards OT goals: Progressing toward goals  Acute Rehab OT Goals Patient Stated Goal: go home  Plan Discharge plan remains appropriate  End of Session Equipment Utilized During Treatment: Gait belt;Rolling walker;Other (comment) (3 in 1)   Activity Tolerance Patient tolerated treatment well   Patient Left in chair;with call bell/phone within reach;with family/visitor present;with chair alarm set   Nurse Communication          Time: [phone] OT Time Calculation (min): 24 min  Charges: OT General Charges $OT Visit: 1 Procedure OT Treatments $Self Care/Home Management : 8-22 mins $Therapeutic Activity: 8-22 mins  Britt Bottom 09/01/2016, 12:40 PM

## 2016-09-01 NOTE — Progress Notes (Signed)
NUTRITION NOTE  Page received from [place], [facility]. She requested education related to hypokalemia and hypophosphatemia. Pt last seen by RD 12/14 and full note written at 1639.  Pt to d/c home today and received 20 mEq IV KPhos x1 dose today. Provided pt and family members, who were at bedside, with handouts outlining K and Phos content of foods and recommended intakes of each mineral per day. Pt drinks Ensure at home and likes chocolate flavor; encouraged continuing this and informed family and pt that chocolate flavor has more Phos than other flavors.   All questions answered and pt and family deny any concerns related to increasing PO intakes of K and Phos after d/c.     Jarome Matin, MS, RD, LDN, [facility] Inpatient Clinical Dietitian Pager # [phone] After hours/weekend pager # [phone]

## 2016-09-01 NOTE — Progress Notes (Signed)
Discharge instructions reviewed with patient and daughter. Both verbalized understanding. Patient discharged via private vehicle.

## 2016-09-01 NOTE — Progress Notes (Signed)
Date: September 01, 2016 [facility] notified of need for RN for wet to Dry dressings. Velva Harman, RN, BSN, [place]   [phone]

## 2016-09-04 NOTE — Discharge Summary (Signed)
Sydney Jordan Discharge Summary   Patient ID: Sydney Jordan MRN: [id number] DOB/AGE: 80-Dec-1931 80 y.o.  Admit date: 08/26/2016 Discharge date: 09/01/2016  Admitting Diagnosis: Perforated viscus Sepsis Pyloric ulcer  Discharge Diagnosis Patient Active Problem List   Diagnosis Date Noted  . Chronic cholecystitis with calculus s/p cholecystectomy 08/26/2016 09/01/2016  . Perforated pyloric ulcer with sepsis s/p omental patch 08/26/2016 08/26/2016  . Tobacco abuse 01/19/2014  . Cholelithiasis 01/19/2014    Consultants Ripudeep Krystal Eaton MD, internal medicine  Imaging: CT abdomen pelvis w contrast 08/26/16: Moderate abdominopelvic free fluid. Associated upper abdominal scattered pneumoperitoneum. Pattern of fluid and free air suggest perforated viscus, suspect related to gastric or duodenal ulcer disease. Increased attenuation of the right pericolic gutter fluid suggest a component of hemoperitoneum.  Other chronic findings as above  DG UGI W/Water Sol Cm 08/29/16: Very slow, delayed emptying of the stomach. Pylorus is thickened and narrowed, possibly related to postoperative edema/ swelling. No visible leak.  Procedures Dr. Zella Richer (08/26/16) - Emergency exploratory laparotomy, primary repair of perforated pyloric ulcer with lesser omentum patch reinforcement, cholecystectomy   Hospital Course:  Sydney Jordan is an 82yo female who [facility]26[facility]16 with 1 week of increasing abdominal pain. CT scan confirmed free air and free fluid in the abdomen consistent with perforated viscus.  Patient was admitted and underwent procedure listed above.  Tolerated procedure well and was transferred to the ICU postoperatively for monitoring; CCM/medicine consulted for assistance with HTN and delirium. Sepsis resolved. Delirium improved to baseline. HTN intially treated with PRN hydralazine and a clonidine patch. Patient did have an ileus postoperatively. UGI POD#3 negative for  leak, therefore once ileus resolved her diet was advanced as tolerated. She worked with PT/OT during this admission. On POD6 the patient was voiding well, tolerating diet, pain well controlled, vital signs stable, incisions c/d/i and felt stable for discharge home.  She will be treated for H pylori with clarithromycin/amoxicillin x14 days, and PPI x6 weeks. Patient will follow up in our office in 2 weeks and knows to call with questions or concerns.  She was discharged on amlodipine '5mg'$  daily for her HTN, and advised to establish PCP in the next couple of weeks for monitoring/management of her BP.  Physical Exam: Gen:  Alert, NAD, pleasant Card:  RRR, no M/G/R heard Pulm:  CTAB, no W/R/R Abd: Soft, NT/ND, +BS, open midline incision clean and healing well with no drainage or surrounding erythema Ext:  No erythema, edema, or tenderness  Allergies as of 09/01/2016   No Known Allergies     Medication List    STOP taking these medications   metoCLOPramide 10 MG tablet Commonly known as:  REGLAN   ondansetron 8 MG disintegrating tablet Commonly known as:  ZOFRAN ODT   oxyCODONE-acetaminophen 5-325 MG tablet Commonly known as:  PERCOCET   ranitidine 150 MG tablet Commonly known as:  ZANTAC     TAKE these medications   amLODipine 5 MG tablet Commonly known as:  NORVASC Take 1 tablet (5 mg total) by mouth daily.   amoxicillin 500 MG capsule Commonly known as:  AMOXIL Take 2 capsules (1,000 mg total) by mouth every 12 (twelve) hours.   clarithromycin 500 MG tablet Commonly known as:  BIAXIN Take 1 tablet (500 mg total) by mouth every 12 (twelve) hours.   feeding supplement (ENSURE ENLIVE) Liqd Take 237 mLs by mouth 2 (two) times daily between meals.   fluconazole 100 MG tablet Commonly known as:  DIFLUCAN  Take 1 tablet (100 mg total) by mouth daily.   multivitamin with minerals Tabs tablet Take 1 tablet by mouth daily.   pantoprazole 40 MG tablet Commonly known as:   PROTONIX Take 1 tablet (40 mg total) by mouth 2 (two) times daily.   saccharomyces boulardii 250 MG capsule Commonly known as:  FLORASTOR Take 1 capsule (250 mg total) by mouth 2 (two) times daily. This is a probiotic, and you can get it over the counter.        Follow-up Information    ROSENBOWER,TODD J, MD. Schedule an appointment as soon as possible for a visit in 2 week(s).   Specialty:  General Jordan Why:  Our office is working on making your appointment. Please call to confirm. Contact information: [place] [address] [phone]        [place]. Call.   Why:  Please call to make an appointment with a primary care physican in the next 2 weeks. Someone needs to help manage your blood pressure and medications. Please check your blood pressure at home or at local pharmacy and keep a lot of the readings. Contact information: [place] [address] [phone]          Signed: Jerrye Beavers, [facility] 09/04/2016, 1:12 PM Pager: [phone] Consults: [phone] Mon-Fri 7:00 am-4:30 pm Sat-Sun 7:00 am-11:30 am

## 2016-11-16 ENCOUNTER — Encounter: Payer: Self-pay | Admitting: Gastroenterology

## 2016-11-24 ENCOUNTER — Encounter (INDEPENDENT_AMBULATORY_CARE_PROVIDER_SITE_OTHER): Payer: Self-pay

## 2016-11-24 ENCOUNTER — Encounter: Payer: Self-pay | Admitting: Gastroenterology

## 2016-11-24 ENCOUNTER — Ambulatory Visit (INDEPENDENT_AMBULATORY_CARE_PROVIDER_SITE_OTHER): Payer: Medicare Other | Admitting: Gastroenterology

## 2016-11-24 VITALS — BP 152/70 | HR 84 | Ht 61.5 in | Wt 102.0 lb

## 2016-11-24 DIAGNOSIS — K257 Chronic gastric ulcer without hemorrhage or perforation: Secondary | ICD-10-CM

## 2016-11-24 NOTE — Progress Notes (Signed)
HPI: This is a  very pleasant 83 year old woman     who was referred to me by Dr. Jackolyn Confer  Chief complaint is recent gastric ulcer she is here with her grandson who seems to be a very helpful care giver for her.  Old Data Reviewed:   December, 2017 she presented with an acute abdomen and sepsis. Imaging revealed free air in her abdomen and she underwent emergent laparotomy. Dr. Elinor Parkinson did this. He found a perforated pyloric ulcer which she repaired directly with an omental patch. He also removed her gallbladder as it was tense and found to have some apparently impacted stones in the cystic duct. She also had a history of symptomatic gallstones.  She's been about 1 week in the hospital and was eventually discharged home.  During her stay, H. pylori IgG antibody was found to be significantly elevated and so she was treated with 2 week course of Prevpac type antibiotics and proton pump inhibitor.  Since leaving hospital she's been doing better, eating better.   She is having no nausea or vomiting. No significant abdominal pains She has actually gained 10-15 pounds since leaving the hospital.  She does not take NSAIDs and never really has.      Review of systems: Pertinent positive and negative review of systems were noted in the above HPI section. Complete review of systems was performed and was otherwise normal.   Past Medical History:  Diagnosis Date  . Cholelithiasis   . Multiple gastric ulcers     Past Surgical History:  Procedure Laterality Date  . CHOLECYSTECTOMY    . LAPAROTOMY N/A 08/26/2016   Procedure: EXPLORATORY LAPAROTOMY, PRIMARY CLOSURE OF PERFORATED PYLORIC ULCER WITH OMENTAL PATCH, OPEN CHOLECYSTECTOMY;  Surgeon: Jackolyn Confer, MD;  Location: [facility];  Service: General;  Laterality: N/A;    Current Outpatient Prescriptions  Medication Sig Dispense Refill  . amLODipine (NORVASC) 5 MG tablet Take 1 tablet (5 mg total) by mouth daily. 30 tablet 1  .  feeding supplement, ENSURE ENLIVE, (ENSURE ENLIVE) LIQD Take 237 mLs by mouth 2 (two) times daily between meals. 237 mL 30  . Multiple Vitamin (MULTIVITAMIN WITH MINERALS) TABS tablet Take 1 tablet by mouth daily.    . pantoprazole (PROTONIX) 40 MG tablet Take 1 tablet (40 mg total) by mouth 2 (two) times daily. 90 tablet 0   No current facility-administered medications for this visit.     Allergies as of 11/24/2016  . (No Known Allergies)    Family History  Problem Relation Age of Onset  . CAD Neg Hx     Social History   Social History  . Marital status: Legally Separated    Spouse name: N/A  . Number of children: 4  . Years of education: N/A   Occupational History  . Retired    Social History Main Topics  . Smoking status: Current Some Day Smoker    Packs/day: 0.50    Years: 65.00  . Smokeless tobacco: Never Used  . Alcohol use No  . Drug use: No  . Sexual activity: Yes    Birth control/ protection: Post-menopausal   Other Topics Concern  . Not on file   Social History Narrative  . No narrative on file     Physical Exam: Ht 5' 1.5" (1.562 m) Comment: height measured without shoes  Wt 102 lb (46.3 kg)   BMI 18.96 kg/m  Constitutiona very frail, elderly ic: alert and oriented x3 Eyes: extraocular movements intact Mouth: oral  pharynx moist, no lesions Neck: supple no lymphadenopathy Cardiovascular: heart regular rate and rhythm Lungs: clear to auscultation bilaterally Abdomen: soft, nontender, nondistended, no obvious ascites, no peritoneal signs, normal bowel sounds Extremities: no lower extremity edema bilaterally Skin: no lesions on visible extremities   Assessment and plan: 83 y.o. female with  recent perforated gastric ulcer, H. pylori antigen positive  she may indeed have had H. pylori related gastric ulcer. I recommended we proceed with EGD to confirm internally that she had the ulcer is healing. I will also biopsied to check for persistent H.  pylori infection. She will continue on proton pump inhibitor twice daily until then. I see no reason for any further blood tests or imaging studies prior to the upper endoscopy.   Please see the "Patient Instructions" section for addition details about the plan.   Sydney Loffler, MD [facility] 11/24/2016, 2:32 PM  Cc:

## 2016-11-24 NOTE — Patient Instructions (Signed)
You will be set up for an upper endoscopy at [facility] for gastric ulcer follow up. Stay on twice daily protonix for now.

## 2016-11-28 ENCOUNTER — Encounter (HOSPITAL_COMMUNITY): Payer: Self-pay | Admitting: *Deleted

## 2016-11-30 ENCOUNTER — Encounter (HOSPITAL_COMMUNITY)
Admission: RE | Disposition: A | Discharge status: Home / Self Care | Payer: Self-pay | Source: Ambulatory Visit | Attending: Gastroenterology

## 2016-11-30 ENCOUNTER — Ambulatory Visit (HOSPITAL_COMMUNITY)
Admission: RE | Admit: 2016-11-30 | Discharge: 2016-11-30 | Disposition: A | Discharge status: Home / Self Care | Payer: Medicare Other | Source: Ambulatory Visit | Attending: Gastroenterology | Admitting: Gastroenterology

## 2016-11-30 ENCOUNTER — Ambulatory Visit (HOSPITAL_COMMUNITY): Payer: Medicare Other | Admitting: Anesthesiology

## 2016-11-30 ENCOUNTER — Encounter (HOSPITAL_COMMUNITY): Payer: Self-pay

## 2016-11-30 DIAGNOSIS — F17210 Nicotine dependence, cigarettes, uncomplicated: Secondary | ICD-10-CM | POA: Diagnosis not present

## 2016-11-30 DIAGNOSIS — K2970 Gastritis, unspecified, without bleeding: Secondary | ICD-10-CM

## 2016-11-30 DIAGNOSIS — Z79899 Other long term (current) drug therapy: Secondary | ICD-10-CM | POA: Insufficient documentation

## 2016-11-30 DIAGNOSIS — Z9049 Acquired absence of other specified parts of digestive tract: Secondary | ICD-10-CM | POA: Insufficient documentation

## 2016-11-30 DIAGNOSIS — K255 Chronic or unspecified gastric ulcer with perforation: Secondary | ICD-10-CM | POA: Diagnosis not present

## 2016-11-30 DIAGNOSIS — K2950 Unspecified chronic gastritis without bleeding: Secondary | ICD-10-CM | POA: Diagnosis not present

## 2016-11-30 DIAGNOSIS — Z09 Encounter for follow-up examination after completed treatment for conditions other than malignant neoplasm: Secondary | ICD-10-CM | POA: Diagnosis present

## 2016-11-30 DIAGNOSIS — K257 Chronic gastric ulcer without hemorrhage or perforation: Secondary | ICD-10-CM

## 2016-11-30 DIAGNOSIS — K2990 Gastroduodenitis, unspecified, without bleeding: Secondary | ICD-10-CM | POA: Diagnosis not present

## 2016-11-30 HISTORY — PX: ESOPHAGOGASTRODUODENOSCOPY (EGD) WITH PROPOFOL: SHX5813

## 2016-11-30 HISTORY — DX: Unspecified osteoarthritis, unspecified site: M19.90

## 2016-11-30 HISTORY — DX: Essential (primary) hypertension: I10

## 2016-11-30 HISTORY — DX: Gastro-esophageal reflux disease without esophagitis: K21.9

## 2016-11-30 SURGERY — ESOPHAGOGASTRODUODENOSCOPY (EGD) WITH PROPOFOL
Anesthesia: Monitor Anesthesia Care

## 2016-11-30 MED ORDER — LACTATED RINGERS IV SOLN
INTRAVENOUS | Status: DC
  Administered 2016-11-30: 1000 mL via INTRAVENOUS

## 2016-11-30 MED ORDER — ONDANSETRON HCL 4 MG/2ML IJ SOLN
INTRAMUSCULAR | Status: DC | PRN
  Administered 2016-11-30: 4 mg via INTRAVENOUS

## 2016-11-30 MED ORDER — SODIUM CHLORIDE 0.9 % IV SOLN: INTRAVENOUS | Status: DC

## 2016-11-30 MED ORDER — LACTATED RINGERS IV SOLN
INTRAVENOUS | Status: DC | PRN
  Administered 2016-11-30: 10:00:00 via INTRAVENOUS

## 2016-11-30 MED ORDER — PROPOFOL 10 MG/ML IV BOLUS
INTRAVENOUS | Status: DC | PRN
  Administered 2016-11-30 (×4): 20 mg via INTRAVENOUS

## 2016-11-30 MED ORDER — LIDOCAINE 2% (20 MG/ML) 5 ML SYRINGE
INTRAMUSCULAR | Status: DC | PRN
  Administered 2016-11-30: 20 mg via INTRAVENOUS

## 2016-11-30 MED ORDER — HYDROMORPHONE HCL 1 MG/ML IJ SOLN: 0.2500 mg | INTRAMUSCULAR | Status: DC | PRN

## 2016-11-30 MED ORDER — PROPOFOL 10 MG/ML IV BOLUS
INTRAVENOUS | Status: AC
  Filled 2016-11-30: qty 40

## 2016-11-30 MED ORDER — PROMETHAZINE HCL 25 MG/ML IJ SOLN: 6.2500 mg | INTRAMUSCULAR | Status: DC | PRN

## 2016-11-30 SURGICAL SUPPLY — 15 items

## 2016-11-30 NOTE — Discharge Instructions (Signed)
YOU HAD AN ENDOSCOPIC PROCEDURE TODAY: Refer to the procedure report and other information in the discharge instructions given to you for any specific questions about what was found during the examination. If this information does not answer your questions, please call [place] office at [phone] to clarify.  ° °YOU SHOULD EXPECT: Some feelings of bloating in the abdomen. Passage of more gas than usual. Walking can help get rid of the air that was put into your GI tract during the procedure and reduce the bloating. If you had a lower endoscopy (such as a colonoscopy or flexible sigmoidoscopy) you may notice spotting of blood in your stool or on the toilet paper. Some abdominal soreness may be present for a day or two, also. ° °DIET: Your first meal following the procedure should be a light meal and then it is ok to progress to your normal diet. A half-sandwich or bowl of soup is an example of a good first meal. Heavy or fried foods are harder to digest and may make you feel nauseous or bloated. Drink plenty of fluids but you should avoid alcoholic beverages for 24 hours. If you had a esophageal dilation, please see attached instructions for diet.   ° °ACTIVITY: Your care partner should take you home directly after the procedure. You should plan to take it easy, moving slowly for the rest of the day. You can resume normal activity the day after the procedure however YOU SHOULD NOT DRIVE, use power tools, machinery or perform tasks that involve climbing or major physical exertion for 24 hours (because of the sedation medicines used during the test).  ° °SYMPTOMS TO REPORT IMMEDIATELY: °A gastroenterologist can be reached at any hour. Please call [phone]  for any of the following symptoms:  °Following lower endoscopy (colonoscopy, flexible sigmoidoscopy) °Excessive amounts of blood in the stool  °Significant tenderness, worsening of abdominal pains  °Swelling of the abdomen that is new, acute  °Fever of 100° or  higher  °Following upper endoscopy (EGD, EUS, ERCP, esophageal dilation) °Vomiting of blood or coffee ground material  °New, significant abdominal pain  °New, significant chest pain or pain under the shoulder blades  °Painful or persistently difficult swallowing  °New shortness of breath  °Black, tarry-looking or red, bloody stools ° °FOLLOW UP:  °If any biopsies were taken you will be contacted by phone or by letter within the next 1-3 weeks. Call [phone]  if you have not heard about the biopsies in 3 weeks.  °Please also call with any specific questions about appointments or follow up tests. ° °

## 2016-11-30 NOTE — H&P (View-Only) (Signed)
HPI: This is a  very pleasant 83 year old woman     who was referred to me by Dr. Jackolyn Confer  Chief complaint is recent gastric ulcer she is here with her grandson who seems to be a very helpful care giver for her.  Old Data Reviewed:   December, 2017 she presented with an acute abdomen and sepsis. Imaging revealed free air in her abdomen and she underwent emergent laparotomy. Dr. Elinor Parkinson did this. He found a perforated pyloric ulcer which she repaired directly with an omental patch. He also removed her gallbladder as it was tense and found to have some apparently impacted stones in the cystic duct. She also had a history of symptomatic gallstones.  She's been about 1 week in the hospital and was eventually discharged home.  During her stay, H. pylori IgG antibody was found to be significantly elevated and so she was treated with 2 week course of Prevpac type antibiotics and proton pump inhibitor.  Since leaving hospital she's been doing better, eating better.   She is having no nausea or vomiting. No significant abdominal pains She has actually gained 10-15 pounds since leaving the hospital.  She does not take NSAIDs and never really has.      Review of systems: Pertinent positive and negative review of systems were noted in the above HPI section. Complete review of systems was performed and was otherwise normal.   Past Medical History:  Diagnosis Date  . Cholelithiasis   . Multiple gastric ulcers     Past Surgical History:  Procedure Laterality Date  . CHOLECYSTECTOMY    . LAPAROTOMY N/A 08/26/2016   Procedure: EXPLORATORY LAPAROTOMY, PRIMARY CLOSURE OF PERFORATED PYLORIC ULCER WITH OMENTAL PATCH, OPEN CHOLECYSTECTOMY;  Surgeon: Jackolyn Confer, MD;  Location: [facility];  Service: General;  Laterality: N/A;    Current Outpatient Prescriptions  Medication Sig Dispense Refill  . amLODipine (NORVASC) 5 MG tablet Take 1 tablet (5 mg total) by mouth daily. 30 tablet 1  .  feeding supplement, ENSURE ENLIVE, (ENSURE ENLIVE) LIQD Take 237 mLs by mouth 2 (two) times daily between meals. 237 mL 30  . Multiple Vitamin (MULTIVITAMIN WITH MINERALS) TABS tablet Take 1 tablet by mouth daily.    . pantoprazole (PROTONIX) 40 MG tablet Take 1 tablet (40 mg total) by mouth 2 (two) times daily. 90 tablet 0   No current facility-administered medications for this visit.     Allergies as of 11/24/2016  . (No Known Allergies)    Family History  Problem Relation Age of Onset  . CAD Neg Hx     Social History   Social History  . Marital status: Legally Separated    Spouse name: N/A  . Number of children: 4  . Years of education: N/A   Occupational History  . Retired    Social History Main Topics  . Smoking status: Current Some Day Smoker    Packs/day: 0.50    Years: 65.00  . Smokeless tobacco: Never Used  . Alcohol use No  . Drug use: No  . Sexual activity: Yes    Birth control/ protection: Post-menopausal   Other Topics Concern  . Not on file   Social History Narrative  . No narrative on file     Physical Exam: Ht 5' 1.5" (1.562 m) Comment: height measured without shoes  Wt 102 lb (46.3 kg)   BMI 18.96 kg/m  Constitutiona very frail, elderly ic: alert and oriented x3 Eyes: extraocular movements intact Mouth: oral  pharynx moist, no lesions Neck: supple no lymphadenopathy Cardiovascular: heart regular rate and rhythm Lungs: clear to auscultation bilaterally Abdomen: soft, nontender, nondistended, no obvious ascites, no peritoneal signs, normal bowel sounds Extremities: no lower extremity edema bilaterally Skin: no lesions on visible extremities   Assessment and plan: 83 y.o. female with  recent perforated gastric ulcer, H. pylori antigen positive  she may indeed have had H. pylori related gastric ulcer. I recommended we proceed with EGD to confirm internally that she had the ulcer is healing. I will also biopsied to check for persistent H.  pylori infection. She will continue on proton pump inhibitor twice daily until then. I see no reason for any further blood tests or imaging studies prior to the upper endoscopy.   Please see the "Patient Instructions" section for addition details about the plan.   Owens Loffler, MD [facility] 11/24/2016, 2:32 PM  Cc:

## 2016-11-30 NOTE — Transfer of Care (Signed)
Immediate Anesthesia Transfer of Care Note  Patient: Sydney Jordan  Procedure(s) Performed: Procedure(s): ESOPHAGOGASTRODUODENOSCOPY (EGD) WITH PROPOFOL (N/A)  Patient Location: PACU and Endoscopy Unit  Anesthesia Type:MAC  Level of Consciousness: sedated  Airway & Oxygen Therapy: Patient Spontanous Breathing and Patient connected to nasal cannula oxygen  Post-op Assessment: Report given to RN and Post -op Vital signs reviewed and stable  Post vital signs: Reviewed and stable  Last Vitals:  Vitals:   11/30/16 0837  BP: (!) 176/73  Pulse: 86  Resp: 19  Temp: 36.7 C    Last Pain:  Vitals:   11/30/16 0837  TempSrc: Oral         Complications: No apparent anesthesia complications

## 2016-11-30 NOTE — Anesthesia Postprocedure Evaluation (Addendum)
Anesthesia Post Note  Patient: Sydney Jordan  Procedure(s) Performed: Procedure(s) (LRB): ESOPHAGOGASTRODUODENOSCOPY (EGD) WITH PROPOFOL (N/A)  Patient location during evaluation: PACU Anesthesia Type: MAC Level of consciousness: awake and alert Pain management: pain level controlled Vital Signs Assessment: post-procedure vital signs reviewed and stable Respiratory status: spontaneous breathing and respiratory function stable Cardiovascular status: stable Anesthetic complications: no       Last Vitals:  Vitals:   11/30/16 1030 11/30/16 1040  BP: (!) 153/63 (!) 167/70  Pulse: 68 66  Resp: 13 18  Temp:      Last Pain:  Vitals:   11/30/16 1018  TempSrc: Oral                 [name]

## 2016-11-30 NOTE — Interval H&P Note (Signed)
History and Physical Interval Note:  11/30/2016 8:38 AM  Sydney Jordan  has presented today for surgery, with the diagnosis of gastric ulcer   The various methods of treatment have been discussed with the patient and family. After consideration of risks, benefits and other options for treatment, the patient has consented to  Procedure(s): ESOPHAGOGASTRODUODENOSCOPY (EGD) WITH PROPOFOL (N/A) as a surgical intervention .  The patient's history has been reviewed, patient examined, no change in status, stable for surgery.  I have reviewed the patient's chart and labs.  Questions were answered to the patient's satisfaction.     Milus Banister

## 2016-11-30 NOTE — Op Note (Signed)
[facility] Patient Name: Sydney Jordan Procedure Date: 11/30/2016 MRN: [id number] Attending MD: Milus Banister , MD Date of Birth: [birth year] CSN: [id number] Age: 83 Admit Type: Outpatient Procedure:                Upper GI endoscopy Indications:              Surveillance procedure; recent perforated gastric                            ulcer, treated surgically, serological H. pylori +                            and she completed antibiotics. Providers:                Milus Banister, MD, Cleda Daub, RN, Cletis Athens, Technician Referring MD:              Medicines:                Monitored Anesthesia Care Complications:            No immediate complications. Estimated blood loss:                            None. Estimated Blood Loss:     Estimated blood loss: none. Procedure:                Pre-Anesthesia Assessment:                           - Prior to the procedure, a History and Physical                            was performed, and patient medications and                            allergies were reviewed. The patient's tolerance of                            previous anesthesia was also reviewed. The risks                            and benefits of the procedure and the sedation                            options and risks were discussed with the patient.                            All questions were answered, and informed consent                            was obtained. Prior Anticoagulants: The patient has                            taken no previous  anticoagulant or antiplatelet                            agents. ASA Grade Assessment: II - A patient with                            mild systemic disease. After reviewing the risks                            and benefits, the patient was deemed in                            satisfactory condition to undergo the procedure.                           After obtaining informed consent,  the endoscope was                            passed under direct vision. Throughout the                            procedure, the patient's blood pressure, pulse, and                            oxygen saturations were monitored continuously. The                            EG-[id number] ([id number]) scope was introduced through the                            mouth, and advanced to the second part of duodenum.                            The upper GI endoscopy was accomplished without                            difficulty. The patient tolerated the procedure                            well. Scope In: Scope Out: Findings:      The esophagus was normal.      Mild inflammation characterized by friability and granularity was found       in the gastric antrum. Biopsies were taken with a cold forceps for       histology.      The duodenum was anatomically abnormal without mucosal irregularity. The       proximal bulb was very tortuous and slightly stenotic however I was able       to pass an adult endoscope without signficant resistence.      The exam was otherwise without abnormality. Impression:               - Normal esophagus.                           - Gastritis. Biopsied to check for residual  H.                            pylori.                           - Tortuous, slightly (focally) stenotic proximal                            duodenal bulb. I suspect this is related to her                            recent perforated ulcer. If she has persistent                            nausea, vomiting develop this would be amenable to                            dilation.                           - The examination was otherwise normal. Moderate Sedation:      N/A- Per Anesthesia Care Recommendation:           - Patient has a contact number available for                            emergencies. The signs and symptoms of potential                            delayed complications were discussed with the                             patient. Return to normal activities tomorrow.                            Written discharge instructions were provided to the                            patient.                           - Resume previous diet.                           - Continue present medications. OK to decrease your                            protonix to once daily, indefinitely.                           - Await pathology results (checking for residual H.                            pylori) Procedure Code(s):        --- Professional ---  63845, Esophagogastroduodenoscopy, flexible,                            transoral; with biopsy, single or multiple Diagnosis Code(s):        --- Professional ---                           K29.70, Gastritis, unspecified, without bleeding CPT copyright 2016 American Medical Association. All rights reserved. The codes documented in this report are preliminary and upon coder review may  be revised to meet current compliance requirements. Milus Banister, MD 11/30/2016 10:16:38 AM This report has been signed electronically. Number of Addenda: 0

## 2016-11-30 NOTE — Anesthesia Preprocedure Evaluation (Addendum)
Anesthesia Evaluation  Patient identified by MRN, date of birth, ID band Patient awake    Reviewed: Allergy & Precautions, NPO status , Patient's Chart, lab work & pertinent test results  History of Anesthesia Complications Negative for: history of anesthetic complications  Airway Mallampati: II  TM Distance: >3 FB Neck ROM: Full    Dental  (+) Dental Advisory Given   Pulmonary Current Smoker,    Pulmonary exam normal        Cardiovascular hypertension, Normal cardiovascular exam     Neuro/Psych negative neurological ROS  negative psych ROS   GI/Hepatic Neg liver ROS, PUD, GERD  ,  Endo/Other  negative endocrine ROS  Renal/GU negative Renal ROS     Musculoskeletal   Abdominal   Peds  Hematology   Anesthesia Other Findings   Reproductive/Obstetrics                            Anesthesia Physical Anesthesia Plan  ASA: II  Anesthesia Plan: MAC   Post-op Pain Management:    Induction: Intravenous  Airway Management Planned: Nasal Cannula and Natural Airway  Additional Equipment:   Intra-op Plan:   Post-operative Plan:   Informed Consent: I have reviewed the patients History and Physical, chart, labs and discussed the procedure including the risks, benefits and alternatives for the proposed anesthesia with the patient or authorized representative who has indicated his/her understanding and acceptance.   Dental advisory given  Plan Discussed with: CRNA and Anesthesiologist  Anesthesia Plan Comments:        Anesthesia Quick Evaluation

## 2017-02-17 NOTE — Addendum Note (Signed)
Addendum  created 02/17/17 1030 by Duane Boston, MD   Sign clinical note

## 2019-08-29 ENCOUNTER — Other Ambulatory Visit: Payer: Self-pay

## 2019-08-29 ENCOUNTER — Ambulatory Visit
Admission: EM | Admit: 2019-08-29 | Discharge: 2019-08-29 | Disposition: A | Discharge status: Home / Self Care | Payer: Medicare Other | Attending: Physician Assistant | Admitting: Physician Assistant

## 2019-08-29 ENCOUNTER — Encounter: Payer: Self-pay | Admitting: Emergency Medicine

## 2019-08-29 DIAGNOSIS — R4702 Dysphasia: Secondary | ICD-10-CM

## 2019-08-29 DIAGNOSIS — Z20828 Contact with and (suspected) exposure to other viral communicable diseases: Secondary | ICD-10-CM

## 2019-08-29 DIAGNOSIS — R0981 Nasal congestion: Secondary | ICD-10-CM | POA: Diagnosis not present

## 2019-08-29 LAB — POC SARS CORONAVIRUS 2 AG -  ED: SARS Coronavirus 2 Ag: NEGATIVE

## 2019-08-29 MED ORDER — PANTOPRAZOLE SODIUM 40 MG PO TBEC: 40.0000 mg | DELAYED_RELEASE_TABLET | Freq: Two times a day (BID) | ORAL | 0 refills | Status: DC

## 2019-08-29 NOTE — ED Notes (Signed)
Patient able to ambulate independently  

## 2019-08-29 NOTE — Discharge Instructions (Addendum)
Rapid COVID negative. PCR testing ordered. I would like you to quarantine until testing results. Start protonix as directed. At this time, eat slowly with small bites, if needed, switch to a puree diet. If worsening trouble swallowing, drooling, leaning forward to breath, go to the emergency department for further evaluation. Otherwise, please call GI doctor today and follow up as soon as possible for further evaluation needed.

## 2019-08-29 NOTE — ED Provider Notes (Signed)
[facility]    CSN: [id number] Arrival date & time: 08/29/19  1047      History   Chief Complaint Chief Complaint  Patient presents with  . Dysphagia  . Nasal Congestion    HPI Sydney Jordan is a 85 y.o. female.   85 year old female comes in with family member for 2 day history of dysphagia, nasal congestion. Denies history of dysphagia. Denies sore throat, swelling of the throat, tripoding, drooling, trismus. Patient started having coughing with swallowing at times, stating she feels that food/fluid is "coming back up".  She denies fever, chills, body aches.  Denies cough.  Denies abdominal pain, nausea, vomiting, diarrhea.  Denies shortness of breath, chest pain, loss of taste or smell.  No obvious sick contact.  Family member denies any obvious slurred speech, facial drooping, one-sided weakness, mental status changes 1 dysphagia for started.  Patient ambulates with shuffling gait at baseline, and this has not changed.  Patient with history of multiple gastric ulcer, had endoscopy done 2 to 3 years ago.  Has not followed up with provider since.  She has not been taking acid reflux medicine since.      Past Medical History:  Diagnosis Date  . Arthritis   . Cholelithiasis   . GERD (gastroesophageal reflux disease)   . Hypertension   . Multiple gastric ulcers     Patient Active Problem List   Diagnosis Date Noted  . Chronic gastric ulcer   . Gastritis and gastroduodenitis   . Chronic cholecystitis with calculus s/p cholecystectomy 08/26/2016 09/01/2016  . Perforated pyloric ulcer with sepsis s/p omental patch 08/26/2016 08/26/2016  . Tobacco abuse 01/19/2014  . Cholelithiasis 01/19/2014    Past Surgical History:  Procedure Laterality Date  . CHOLECYSTECTOMY    . ESOPHAGOGASTRODUODENOSCOPY (EGD) WITH PROPOFOL N/A 11/30/2016   Procedure: ESOPHAGOGASTRODUODENOSCOPY (EGD) WITH PROPOFOL;  Surgeon: Milus Banister, MD;  Location: [facility];  Service:  Endoscopy;  Laterality: N/A;  . LAPAROTOMY N/A 08/26/2016   Procedure: EXPLORATORY LAPAROTOMY, PRIMARY CLOSURE OF PERFORATED PYLORIC ULCER WITH OMENTAL PATCH, OPEN CHOLECYSTECTOMY;  Surgeon: Jackolyn Confer, MD;  Location: [facility];  Service: General;  Laterality: N/A;    OB History   No obstetric history on file.      Home Medications    Prior to Admission medications   Medication Sig Start Date End Date Taking? Authorizing Provider  amLODipine (NORVASC) 5 MG tablet Take 1 tablet (5 mg total) by mouth daily. 09/01/16  Yes Meuth, Brooke A, PA-C  feeding supplement, ENSURE ENLIVE, (ENSURE ENLIVE) LIQD Take 237 mLs by mouth 2 (two) times daily between meals. 09/01/16   Meuth, Brooke A, PA-C  Multiple Vitamin (MULTIVITAMIN WITH MINERALS) TABS tablet Take 1 tablet by mouth daily. 09/01/16   Meuth, Brooke A, PA-C  pantoprazole (PROTONIX) 40 MG tablet Take 1 tablet (40 mg total) by mouth 2 (two) times daily. 08/29/19   Ok Edwards, PA-C    Family History Family History  Problem Relation Age of Onset  . CAD Neg Hx     Social History Social History   Tobacco Use  . Smoking status: Current Some Day Smoker    Packs/day: 0.50    Years: 65.00    Pack years: 32.50  . Smokeless tobacco: Never Used  Substance Use Topics  . Alcohol use: No  . Drug use: No     Allergies   Patient has no known allergies.   Review of Systems Review of Systems  Reason unable to perform ROS: See HPI as above.     Physical Exam Triage Vital Signs ED Triage Vitals [08/29/19 1109]  Enc Vitals Group     BP (!) 157/89     Pulse Rate 86     Resp 18     Temp 98 F (36.7 C)     Temp Source Temporal     SpO2 93 %     Weight      Height      Head Circumference      Peak Flow      Pain Score 0     Pain Loc      Pain Edu?      Excl. in [place]?    No data found.  Updated Vital Signs BP (!) 157/89 (BP Location: Left Arm)   Pulse 86   Temp 98 F (36.7 C) (Temporal)   Resp 18   SpO2 93%   Physical  Exam Constitutional:      General: She is not in acute distress.    Appearance: Normal appearance. She is not ill-appearing, toxic-appearing or diaphoretic.     Comments: Cachectic  HENT:     Head: Normocephalic and atraumatic.     Mouth/Throat:     Pharynx: Oropharynx is clear. Uvula midline. No pharyngeal swelling, posterior oropharyngeal erythema or uvula swelling.     Comments: Patient able to tolerate own secretions well, though mucosal membrane slightly dry.  No tripoding, drooling, trismus.  No coughing throughout exam. Eyes:     Extraocular Movements: Extraocular movements intact.     Conjunctiva/sclera: Conjunctivae normal.     Pupils: Pupils are equal, round, and reactive to light.     Comments: Right eyelid unable to close, at baseline.   Cardiovascular:     Rate and Rhythm: Normal rate and regular rhythm.     Heart sounds: Normal heart sounds. No murmur. No friction rub. No gallop.   Pulmonary:     Effort: Pulmonary effort is normal. No accessory muscle usage, prolonged expiration, respiratory distress or retractions.     Comments: Lungs clear to auscultation without adventitious lung sounds. Abdominal:     General: Bowel sounds are normal.     Palpations: Abdomen is soft.     Tenderness: There is no abdominal tenderness. There is no guarding or rebound.  Musculoskeletal:     Cervical back: Normal range of motion and neck supple.  Skin:    General: Skin is warm and dry.  Neurological:     General: No focal deficit present.     Mental Status: She is alert and oriented to Jordan, place, and time.     GCS: GCS eye subscore is 4. GCS verbal subscore is 5. GCS motor subscore is 6.     Comments: No obvious facial drooping. Strength equal bilaterally. No pronator drift. Shuffling gait that is at baseline.       UC Treatments / Results  Labs (all labs ordered are listed, but only abnormal results are displayed) Labs Reviewed  NOVEL CORONAVIRUS, NAA  POC SARS CORONAVIRUS  2 AG -  ED    EKG   Radiology No results found.  Procedures Procedures (including critical care time)  Medications Ordered in UC Medications - No data to display  Initial Impression / Assessment and Plan / UC Course  I have reviewed the triage vital signs and the nursing notes.  Pertinent labs & imaging results that were available during my care of the patient were  reviewed by me and considered in my medical decision making (see chart for details).    Discussed case with Dr. Meda Coffee.  Patient without any choking in office today.  Will test for rapid Covid at this time.  Rapid Covid negative.  PCR Covid testing ordered.  Patient to remain in quarantine until testing results return.  Will restart Protonix for possible acid reflux/stricture causing symptoms. Discussed small bites/puree diet plan at this time with close monitoring. Strict return precautions given. Otherwise, follow up with GI for further evaluation needed. Patient and family member expresses understanding and agrees to plan.  Case discussed with Dr Meda Coffee, who agrees to plan.  Final Clinical Impressions(s) / UC Diagnoses   Final diagnoses:  Dysphasia  Nasal congestion   ED Prescriptions    Medication Sig Dispense Auth. Provider   pantoprazole (PROTONIX) 40 MG tablet  (Status: Discontinued) Take 1 tablet (40 mg total) by mouth 2 (two) times daily. 20 tablet Tashema Tiller V, PA-C   pantoprazole (PROTONIX) 40 MG tablet Take 1 tablet (40 mg total) by mouth 2 (two) times daily. 20 tablet Ok Edwards, PA-C     PDMP not reviewed this encounter.   Ok Edwards, PA-C 08/29/19 1220

## 2019-08-29 NOTE — ED Triage Notes (Signed)
Pt presents to [facility] for assessment of 2 days of difficulty swallowing.  Pt endorses coughing when she tries to swallow, and states "it comes back up".  Also c/o nasal congestion.

## 2019-08-31 LAB — NOVEL CORONAVIRUS, NAA: SARS-CoV-2, NAA: NOT DETECTED

## 2019-10-15 ENCOUNTER — Other Ambulatory Visit: Payer: Self-pay

## 2019-10-15 ENCOUNTER — Inpatient Hospital Stay (HOSPITAL_COMMUNITY)
Admission: EM | Admit: 2019-10-15 | Discharge: 2019-10-18 | DRG: 180 | Disposition: A | Discharge status: Hospice | Payer: Medicare Other | Attending: Internal Medicine | Admitting: Internal Medicine

## 2019-10-15 ENCOUNTER — Encounter (HOSPITAL_COMMUNITY): Payer: Self-pay | Admitting: Emergency Medicine

## 2019-10-15 DIAGNOSIS — F419 Anxiety disorder, unspecified: Secondary | ICD-10-CM | POA: Diagnosis present

## 2019-10-15 DIAGNOSIS — I82612 Acute embolism and thrombosis of superficial veins of left upper extremity: Secondary | ICD-10-CM | POA: Diagnosis present

## 2019-10-15 DIAGNOSIS — Z515 Encounter for palliative care: Secondary | ICD-10-CM

## 2019-10-15 DIAGNOSIS — Z20822 Contact with and (suspected) exposure to covid-19: Secondary | ICD-10-CM | POA: Diagnosis present

## 2019-10-15 DIAGNOSIS — J90 Pleural effusion, not elsewhere classified: Secondary | ICD-10-CM | POA: Insufficient documentation

## 2019-10-15 DIAGNOSIS — K219 Gastro-esophageal reflux disease without esophagitis: Secondary | ICD-10-CM | POA: Diagnosis present

## 2019-10-15 DIAGNOSIS — R627 Adult failure to thrive: Secondary | ICD-10-CM | POA: Diagnosis not present

## 2019-10-15 DIAGNOSIS — M1990 Unspecified osteoarthritis, unspecified site: Secondary | ICD-10-CM | POA: Diagnosis present

## 2019-10-15 DIAGNOSIS — G902 Horner's syndrome: Secondary | ICD-10-CM | POA: Diagnosis present

## 2019-10-15 DIAGNOSIS — R1013 Epigastric pain: Secondary | ICD-10-CM

## 2019-10-15 DIAGNOSIS — I82629 Acute embolism and thrombosis of deep veins of unspecified upper extremity: Secondary | ICD-10-CM | POA: Diagnosis present

## 2019-10-15 DIAGNOSIS — Z720 Tobacco use: Secondary | ICD-10-CM | POA: Diagnosis present

## 2019-10-15 DIAGNOSIS — R918 Other nonspecific abnormal finding of lung field: Secondary | ICD-10-CM | POA: Diagnosis present

## 2019-10-15 DIAGNOSIS — R0602 Shortness of breath: Secondary | ICD-10-CM

## 2019-10-15 DIAGNOSIS — E43 Unspecified severe protein-calorie malnutrition: Secondary | ICD-10-CM | POA: Diagnosis present

## 2019-10-15 DIAGNOSIS — H9190 Unspecified hearing loss, unspecified ear: Secondary | ICD-10-CM | POA: Diagnosis present

## 2019-10-15 DIAGNOSIS — R1310 Dysphagia, unspecified: Secondary | ICD-10-CM | POA: Diagnosis present

## 2019-10-15 DIAGNOSIS — I82622 Acute embolism and thrombosis of deep veins of left upper extremity: Secondary | ICD-10-CM | POA: Diagnosis present

## 2019-10-15 DIAGNOSIS — C3490 Malignant neoplasm of unspecified part of unspecified bronchus or lung: Secondary | ICD-10-CM | POA: Diagnosis present

## 2019-10-15 DIAGNOSIS — C3492 Malignant neoplasm of unspecified part of left bronchus or lung: Secondary | ICD-10-CM | POA: Diagnosis not present

## 2019-10-15 DIAGNOSIS — R600 Localized edema: Secondary | ICD-10-CM

## 2019-10-15 DIAGNOSIS — Z681 Body mass index (BMI) 19 or less, adult: Secondary | ICD-10-CM

## 2019-10-15 DIAGNOSIS — R490 Dysphonia: Secondary | ICD-10-CM | POA: Diagnosis present

## 2019-10-15 DIAGNOSIS — I10 Essential (primary) hypertension: Secondary | ICD-10-CM | POA: Diagnosis present

## 2019-10-15 DIAGNOSIS — J3800 Paralysis of vocal cords and larynx, unspecified: Secondary | ICD-10-CM | POA: Diagnosis present

## 2019-10-15 DIAGNOSIS — J189 Pneumonia, unspecified organism: Secondary | ICD-10-CM

## 2019-10-15 DIAGNOSIS — J3801 Paralysis of vocal cords and larynx, unilateral: Secondary | ICD-10-CM | POA: Diagnosis present

## 2019-10-15 DIAGNOSIS — I82B12 Acute embolism and thrombosis of left subclavian vein: Secondary | ICD-10-CM | POA: Diagnosis present

## 2019-10-15 DIAGNOSIS — Z8711 Personal history of peptic ulcer disease: Secondary | ICD-10-CM

## 2019-10-15 DIAGNOSIS — Z66 Do not resuscitate: Secondary | ICD-10-CM | POA: Diagnosis present

## 2019-10-15 DIAGNOSIS — Z9889 Other specified postprocedural states: Secondary | ICD-10-CM

## 2019-10-15 DIAGNOSIS — Z79899 Other long term (current) drug therapy: Secondary | ICD-10-CM

## 2019-10-15 DIAGNOSIS — I82A12 Acute embolism and thrombosis of left axillary vein: Secondary | ICD-10-CM | POA: Diagnosis present

## 2019-10-15 DIAGNOSIS — Z9049 Acquired absence of other specified parts of digestive tract: Secondary | ICD-10-CM

## 2019-10-15 DIAGNOSIS — F17210 Nicotine dependence, cigarettes, uncomplicated: Secondary | ICD-10-CM | POA: Diagnosis present

## 2019-10-15 DIAGNOSIS — R64 Cachexia: Secondary | ICD-10-CM | POA: Diagnosis present

## 2019-10-15 DIAGNOSIS — Z8521 Personal history of malignant neoplasm of larynx: Secondary | ICD-10-CM

## 2019-10-15 DIAGNOSIS — C3491 Malignant neoplasm of unspecified part of right bronchus or lung: Secondary | ICD-10-CM | POA: Diagnosis present

## 2019-10-15 NOTE — ED Triage Notes (Signed)
Patient complaining of poor appetite, left hand is swollen, and sore throat. Patient is with grandson. Grandson states patient left hand started swelling yesterday.

## 2019-10-16 ENCOUNTER — Encounter (HOSPITAL_COMMUNITY): Payer: Self-pay | Admitting: Internal Medicine

## 2019-10-16 ENCOUNTER — Inpatient Hospital Stay (HOSPITAL_COMMUNITY): Payer: Medicare Other

## 2019-10-16 ENCOUNTER — Emergency Department (HOSPITAL_COMMUNITY): Payer: Medicare Other

## 2019-10-16 ENCOUNTER — Observation Stay (HOSPITAL_COMMUNITY): Payer: Medicare Other

## 2019-10-16 DIAGNOSIS — J189 Pneumonia, unspecified organism: Secondary | ICD-10-CM

## 2019-10-16 DIAGNOSIS — I82612 Acute embolism and thrombosis of superficial veins of left upper extremity: Secondary | ICD-10-CM | POA: Diagnosis present

## 2019-10-16 DIAGNOSIS — R918 Other nonspecific abnormal finding of lung field: Secondary | ICD-10-CM | POA: Diagnosis not present

## 2019-10-16 DIAGNOSIS — J90 Pleural effusion, not elsewhere classified: Secondary | ICD-10-CM | POA: Diagnosis not present

## 2019-10-16 DIAGNOSIS — C3492 Malignant neoplasm of unspecified part of left bronchus or lung: Secondary | ICD-10-CM | POA: Diagnosis present

## 2019-10-16 DIAGNOSIS — I82B12 Acute embolism and thrombosis of left subclavian vein: Secondary | ICD-10-CM | POA: Diagnosis present

## 2019-10-16 DIAGNOSIS — R627 Adult failure to thrive: Secondary | ICD-10-CM | POA: Diagnosis present

## 2019-10-16 DIAGNOSIS — R490 Dysphonia: Secondary | ICD-10-CM | POA: Diagnosis present

## 2019-10-16 DIAGNOSIS — K219 Gastro-esophageal reflux disease without esophagitis: Secondary | ICD-10-CM | POA: Diagnosis present

## 2019-10-16 DIAGNOSIS — F419 Anxiety disorder, unspecified: Secondary | ICD-10-CM | POA: Diagnosis present

## 2019-10-16 DIAGNOSIS — I10 Essential (primary) hypertension: Secondary | ICD-10-CM | POA: Diagnosis present

## 2019-10-16 DIAGNOSIS — C3491 Malignant neoplasm of unspecified part of right bronchus or lung: Secondary | ICD-10-CM | POA: Diagnosis present

## 2019-10-16 DIAGNOSIS — Z66 Do not resuscitate: Secondary | ICD-10-CM | POA: Diagnosis present

## 2019-10-16 DIAGNOSIS — Z9889 Other specified postprocedural states: Secondary | ICD-10-CM | POA: Diagnosis not present

## 2019-10-16 DIAGNOSIS — I82A12 Acute embolism and thrombosis of left axillary vein: Secondary | ICD-10-CM | POA: Diagnosis present

## 2019-10-16 DIAGNOSIS — J3801 Paralysis of vocal cords and larynx, unilateral: Secondary | ICD-10-CM | POA: Diagnosis not present

## 2019-10-16 DIAGNOSIS — Z20822 Contact with and (suspected) exposure to covid-19: Secondary | ICD-10-CM | POA: Diagnosis present

## 2019-10-16 DIAGNOSIS — R1310 Dysphagia, unspecified: Secondary | ICD-10-CM | POA: Diagnosis present

## 2019-10-16 DIAGNOSIS — M1990 Unspecified osteoarthritis, unspecified site: Secondary | ICD-10-CM | POA: Diagnosis present

## 2019-10-16 DIAGNOSIS — Z681 Body mass index (BMI) 19 or less, adult: Secondary | ICD-10-CM | POA: Diagnosis not present

## 2019-10-16 DIAGNOSIS — Z79899 Other long term (current) drug therapy: Secondary | ICD-10-CM | POA: Diagnosis not present

## 2019-10-16 DIAGNOSIS — I82622 Acute embolism and thrombosis of deep veins of left upper extremity: Secondary | ICD-10-CM | POA: Diagnosis present

## 2019-10-16 DIAGNOSIS — Z8711 Personal history of peptic ulcer disease: Secondary | ICD-10-CM | POA: Diagnosis not present

## 2019-10-16 DIAGNOSIS — R609 Edema, unspecified: Secondary | ICD-10-CM | POA: Diagnosis not present

## 2019-10-16 DIAGNOSIS — E43 Unspecified severe protein-calorie malnutrition: Secondary | ICD-10-CM | POA: Diagnosis present

## 2019-10-16 DIAGNOSIS — G902 Horner's syndrome: Secondary | ICD-10-CM | POA: Diagnosis present

## 2019-10-16 DIAGNOSIS — Z9049 Acquired absence of other specified parts of digestive tract: Secondary | ICD-10-CM | POA: Diagnosis not present

## 2019-10-16 DIAGNOSIS — F17210 Nicotine dependence, cigarettes, uncomplicated: Secondary | ICD-10-CM | POA: Diagnosis present

## 2019-10-16 DIAGNOSIS — R64 Cachexia: Secondary | ICD-10-CM | POA: Diagnosis present

## 2019-10-16 DIAGNOSIS — Z720 Tobacco use: Secondary | ICD-10-CM

## 2019-10-16 DIAGNOSIS — C3490 Malignant neoplasm of unspecified part of unspecified bronchus or lung: Secondary | ICD-10-CM | POA: Diagnosis not present

## 2019-10-16 LAB — LIPASE, BLOOD: Lipase: 22 U/L (ref 11–51)

## 2019-10-16 LAB — COMPREHENSIVE METABOLIC PANEL
ALT: 15 U/L (ref 0–44)
AST: 31 U/L (ref 15–41)
Albumin: 3.4 g/dL — ABNORMAL LOW (ref 3.5–5.0)
Alkaline Phosphatase: 61 U/L (ref 38–126)
Anion gap: 20 — ABNORMAL HIGH (ref 5–15)
BUN: 24 mg/dL — ABNORMAL HIGH (ref 8–23)
CO2: 24 mmol/L (ref 22–32)
Calcium: 9.7 mg/dL (ref 8.9–10.3)
Chloride: 97 mmol/L — ABNORMAL LOW (ref 98–111)
Creatinine, Ser: 0.99 mg/dL (ref 0.44–1.00)
GFR calc Af Amer: 59 mL/min — ABNORMAL LOW (ref >60)
GFR calc non Af Amer: 51 mL/min — ABNORMAL LOW (ref >60)
Glucose, Bld: 105 mg/dL — ABNORMAL HIGH (ref 70–99)
Potassium: 4 mmol/L (ref 3.5–5.1)
Sodium: 141 mmol/L (ref 135–145)
Total Bilirubin: 1.3 mg/dL — ABNORMAL HIGH (ref 0.3–1.2)
Total Protein: 7.9 g/dL (ref 6.5–8.1)

## 2019-10-16 LAB — BODY FLUID CELL COUNT WITH DIFFERENTIAL
Lymphs, Fluid: 30 %
Monocyte-Macrophage-Serous Fluid: 44 % — ABNORMAL LOW (ref 50–90)
Neutrophil Count, Fluid: 26 % — ABNORMAL HIGH (ref 0–25)
Total Nucleated Cell Count, Fluid: 290 cu mm (ref 0–1000)

## 2019-10-16 LAB — CBC WITH DIFFERENTIAL/PLATELET
Abs Immature Granulocytes: 0.18 10*3/uL — ABNORMAL HIGH (ref 0.00–0.07)
Basophils Absolute: 0 10*3/uL (ref 0.0–0.1)
Basophils Relative: 0 %
Eosinophils Absolute: 0 10*3/uL (ref 0.0–0.5)
Eosinophils Relative: 0 %
HCT: 40.5 % (ref 36.0–46.0)
Hemoglobin: 12.8 g/dL (ref 12.0–15.0)
Immature Granulocytes: 1 %
Lymphocytes Relative: 6 %
Lymphs Abs: 0.8 10*3/uL (ref 0.7–4.0)
MCH: 23.2 pg — ABNORMAL LOW (ref 26.0–34.0)
MCHC: 31.6 g/dL (ref 30.0–36.0)
MCV: 73.5 fL — ABNORMAL LOW (ref 80.0–100.0)
Monocytes Absolute: 0.8 10*3/uL (ref 0.1–1.0)
Monocytes Relative: 6 %
Neutro Abs: 11.2 10*3/uL — ABNORMAL HIGH (ref 1.7–7.7)
Neutrophils Relative %: 87 %
Platelets: 195 10*3/uL (ref 150–400)
RBC: 5.51 MIL/uL — ABNORMAL HIGH (ref 3.87–5.11)
RDW: 19.7 % — ABNORMAL HIGH (ref 11.5–15.5)
WBC: 12.9 10*3/uL — ABNORMAL HIGH (ref 4.0–10.5)
nRBC: 0 % (ref 0.0–0.2)

## 2019-10-16 LAB — BRAIN NATRIURETIC PEPTIDE: B Natriuretic Peptide: 73.6 pg/mL (ref 0.0–100.0)

## 2019-10-16 LAB — PROTEIN, PLEURAL OR PERITONEAL FLUID: Total protein, fluid: 4.7 g/dL

## 2019-10-16 LAB — LACTATE DEHYDROGENASE, PLEURAL OR PERITONEAL FLUID: LD, Fluid: 1763 U/L — ABNORMAL HIGH (ref 3–23)

## 2019-10-16 LAB — ALBUMIN, PLEURAL OR PERITONEAL FLUID: Albumin, Fluid: 2.4 g/dL

## 2019-10-16 LAB — GLUCOSE, PLEURAL OR PERITONEAL FLUID: Glucose, Fluid: 83 mg/dL

## 2019-10-16 LAB — MRSA PCR SCREENING: MRSA by PCR: NEGATIVE

## 2019-10-16 LAB — SARS CORONAVIRUS 2 (TAT 6-24 HRS): SARS Coronavirus 2: NEGATIVE

## 2019-10-16 MED ORDER — PNEUMOCOCCAL VAC POLYVALENT 25 MCG/0.5ML IJ INJ
0.5000 mL | INJECTION | INTRAMUSCULAR | Status: DC
  Filled 2019-10-16: qty 0.5

## 2019-10-16 MED ORDER — VANCOMYCIN HCL IN DEXTROSE 1-5 GM/200ML-% IV SOLN
1000.0000 mg | Freq: Once | INTRAVENOUS | Status: AC
  Administered 2019-10-16: 10:00:00 1000 mg via INTRAVENOUS
  Filled 2019-10-16: qty 200

## 2019-10-16 MED ORDER — SODIUM CHLORIDE 0.9 % IV SOLN
2.0000 g | Freq: Two times a day (BID) | INTRAVENOUS | Status: DC
  Administered 2019-10-16 – 2019-10-17 (×3): 2 g via INTRAVENOUS
  Filled 2019-10-16 (×4): qty 2

## 2019-10-16 MED ORDER — VANCOMYCIN HCL 750 MG/150ML IV SOLN: 750.0000 mg | INTRAVENOUS | Status: DC

## 2019-10-16 MED ORDER — IOHEXOL 300 MG/ML  SOLN
75.0000 mL | Freq: Once | INTRAMUSCULAR | Status: AC | PRN
  Administered 2019-10-16: 04:00:00 75 mL via INTRAVENOUS

## 2019-10-16 MED ORDER — PIPERACILLIN-TAZOBACTAM 3.375 G IVPB: 3.3750 g | Freq: Three times a day (TID) | INTRAVENOUS | Status: DC

## 2019-10-16 MED ORDER — FUROSEMIDE 10 MG/ML IJ SOLN
20.0000 mg | Freq: Once | INTRAMUSCULAR | Status: AC
  Administered 2019-10-16: 03:00:00 20 mg via INTRAVENOUS
  Filled 2019-10-16: qty 4

## 2019-10-16 MED ORDER — HYDRALAZINE HCL 20 MG/ML IJ SOLN: 10.0000 mg | INTRAMUSCULAR | Status: DC | PRN

## 2019-10-16 MED ORDER — ALUM & MAG HYDROXIDE-SIMETH 200-200-20 MG/5ML PO SUSP
30.0000 mL | Freq: Once | ORAL | Status: AC
  Administered 2019-10-16: 01:00:00 30 mL via ORAL
  Filled 2019-10-16: qty 30

## 2019-10-16 MED ORDER — LIDOCAINE HCL 1 % IJ SOLN
INTRAMUSCULAR | Status: AC
  Filled 2019-10-16: qty 20

## 2019-10-16 MED ORDER — PANTOPRAZOLE SODIUM 40 MG PO TBEC
40.0000 mg | DELAYED_RELEASE_TABLET | Freq: Every day | ORAL | Status: DC
  Administered 2019-10-18: 40 mg via ORAL
  Filled 2019-10-16 (×2): qty 1

## 2019-10-16 MED ORDER — SODIUM CHLORIDE 0.9 % IV BOLUS
500.0000 mL | Freq: Once | INTRAVENOUS | Status: AC
  Administered 2019-10-16: 01:00:00 500 mL via INTRAVENOUS

## 2019-10-16 MED ORDER — INFLUENZA VAC A&B SA ADJ QUAD 0.5 ML IM PRSY
0.5000 mL | PREFILLED_SYRINGE | INTRAMUSCULAR | Status: DC
  Filled 2019-10-16: qty 0.5

## 2019-10-16 MED ORDER — SODIUM CHLORIDE (PF) 0.9 % IJ SOLN
INTRAMUSCULAR | Status: AC
  Filled 2019-10-16: qty 50

## 2019-10-16 MED ORDER — ACETAMINOPHEN 325 MG PO TABS: 650.0000 mg | ORAL_TABLET | Freq: Four times a day (QID) | ORAL | Status: DC | PRN

## 2019-10-16 MED ORDER — THIAMINE HCL 100 MG/ML IJ SOLN
100.0000 mg | Freq: Once | INTRAMUSCULAR | Status: AC
  Administered 2019-10-16: 02:00:00 100 mg via INTRAVENOUS
  Filled 2019-10-16: qty 2

## 2019-10-16 MED ORDER — ENSURE ENLIVE PO LIQD
237.0000 mL | Freq: Two times a day (BID) | ORAL | Status: DC
  Administered 2019-10-17 (×2): 237 mL via ORAL

## 2019-10-16 MED ORDER — ONDANSETRON HCL 4 MG/2ML IJ SOLN: 4.0000 mg | Freq: Four times a day (QID) | INTRAMUSCULAR | Status: DC | PRN

## 2019-10-16 MED ORDER — ACETAMINOPHEN 650 MG RE SUPP: 650.0000 mg | Freq: Four times a day (QID) | RECTAL | Status: DC | PRN

## 2019-10-16 MED ORDER — ONDANSETRON HCL 4 MG PO TABS: 4.0000 mg | ORAL_TABLET | Freq: Four times a day (QID) | ORAL | Status: DC | PRN

## 2019-10-16 MED ORDER — BOOST / RESOURCE BREEZE PO LIQD CUSTOM
1.0000 | ORAL | Status: DC
  Administered 2019-10-18: 1 via ORAL

## 2019-10-16 MED ORDER — ADULT MULTIVITAMIN W/MINERALS CH: 1.0000 | ORAL_TABLET | Freq: Every day | ORAL | Status: DC

## 2019-10-16 NOTE — ED Provider Notes (Signed)
[facility] DEPT Provider Note  CSN: [id number] Arrival date & time: 10/15/19 2223  Chief Complaint(s) Anorexia and Hand Problem (left hand swollen)  HPI Sydney Jordan is a 86 y.o. female with a past medical history listed below who presents to the emergency department with several days of left hand swelling.  Family also reports several month history of anorexia, decreased appetite.  Patient reports that she has trouble swallowing at times.  Says that she tries to make her self eat but either does not have the appetite or food does not stay down.  States that she is able to tolerate oral fluids.  Patient endorses upper abdominal discomfort with eating.  Family denies any trauma.  There is no chest pain or shortness of breath.  No prior history of cardiac issues.  Patient reports being a longtime smoker  HPI  Past Medical History Past Medical History:  Diagnosis Date  . Arthritis   . Cholelithiasis   . GERD (gastroesophageal reflux disease)   . Hypertension   . Multiple gastric ulcers    Patient Active Problem List   Diagnosis Date Noted  . Pleural effusion 10/16/2019  . Chronic gastric ulcer   . Gastritis and gastroduodenitis   . Chronic cholecystitis with calculus s/p cholecystectomy 08/26/2016 09/01/2016  . Perforated pyloric ulcer with sepsis s/p omental patch 08/26/2016 08/26/2016  . Tobacco abuse 01/19/2014  . Cholelithiasis 01/19/2014   Home Medication(s) Prior to Admission medications   Medication Sig Start Date End Date Taking? Authorizing Provider  feeding supplement, ENSURE ENLIVE, (ENSURE ENLIVE) LIQD Take 237 mLs by mouth 2 (two) times daily between meals. 09/01/16  Yes Meuth, Brooke A, PA-C  amLODipine (NORVASC) 5 MG tablet Take 1 tablet (5 mg total) by mouth daily. Patient not taking: Reported on 10/15/2019 09/01/16   Margie Billet A, PA-C  Multiple Vitamin (MULTIVITAMIN WITH MINERALS) TABS tablet Take 1 tablet by mouth  daily. Patient not taking: Reported on 10/15/2019 09/01/16   Margie Billet A, PA-C  pantoprazole (PROTONIX) 40 MG tablet Take 1 tablet (40 mg total) by mouth 2 (two) times daily. Patient not taking: Reported on 10/15/2019 08/29/19   Arturo Morton                                                                                                                                    Past Surgical History Past Surgical History:  Procedure Laterality Date  . CHOLECYSTECTOMY    . ESOPHAGOGASTRODUODENOSCOPY (EGD) WITH PROPOFOL N/A 11/30/2016   Procedure: ESOPHAGOGASTRODUODENOSCOPY (EGD) WITH PROPOFOL;  Surgeon: Milus Banister, MD;  Location: [facility];  Service: Endoscopy;  Laterality: N/A;  . LAPAROTOMY N/A 08/26/2016   Procedure: EXPLORATORY LAPAROTOMY, PRIMARY CLOSURE OF PERFORATED PYLORIC ULCER WITH OMENTAL PATCH, OPEN CHOLECYSTECTOMY;  Surgeon: Jackolyn Confer, MD;  Location: [facility];  Service: General;  Laterality: N/A;   Family History Family History  Problem Relation Age of Onset  .  CAD Neg Hx     Social History Social History   Tobacco Use  . Smoking status: Current Some Day Smoker    Packs/day: 0.50    Years: 65.00    Pack years: 32.50  . Smokeless tobacco: Never Used  Substance Use Topics  . Alcohol use: No  . Drug use: No   Allergies Patient has no known allergies.  Review of Systems Review of Systems All other systems are reviewed and are negative for acute change except as noted in the HPI  Physical Exam Vital Signs  I have reviewed the triage vital signs BP 109/69 (BP Location: Right Arm)   Pulse 88   Temp 97.6 F (36.4 C) (Oral)   Resp 15   SpO2 92%   Physical Exam Vitals reviewed.  Constitutional:      General: She is not in acute distress.    Appearance: She is well-developed. She is cachectic. She is not diaphoretic.  HENT:     Head: Normocephalic and atraumatic.     Nose: Nose normal.  Eyes:     General: No scleral icterus.       Right eye: No  discharge.        Left eye: No discharge.     Conjunctiva/sclera: Conjunctivae normal.     Pupils: Pupils are equal, round, and reactive to light.  Cardiovascular:     Rate and Rhythm: Normal rate and regular rhythm.     Heart sounds: No murmur. No friction rub. No gallop.   Pulmonary:     Effort: Pulmonary effort is normal. No respiratory distress.     Breath sounds: No stridor. Examination of the left-middle field reveals rales. Examination of the right-lower field reveals rales. Examination of the left-lower field reveals rales. Rales present.  Abdominal:     General: There is no distension.     Palpations: Abdomen is soft.     Tenderness: There is no abdominal tenderness.  Musculoskeletal:        General: No tenderness.       Arms:     Cervical back: Normal range of motion and neck supple.  Skin:    General: Skin is warm and dry.     Findings: No erythema or rash.  Neurological:     Mental Status: She is alert and oriented to person, place, and time.     ED Results and Treatments Labs (all labs ordered are listed, but only abnormal results are displayed) Labs Reviewed  CBC WITH DIFFERENTIAL/PLATELET - Abnormal; Notable for the following components:      Result Value   WBC 12.9 (*)    RBC 5.51 (*)    MCV 73.5 (*)    MCH 23.2 (*)    RDW 19.7 (*)    Neutro Abs 11.2 (*)    Abs Immature Granulocytes 0.18 (*)    All other components within normal limits  COMPREHENSIVE METABOLIC PANEL - Abnormal; Notable for the following components:   Chloride 97 (*)    Glucose, Bld 105 (*)    BUN 24 (*)    Albumin 3.4 (*)    Total Bilirubin 1.3 (*)    GFR calc non Af Amer 51 (*)    GFR calc Af Amer 59 (*)    Anion gap 20 (*)    All other components within normal limits  SARS CORONAVIRUS 2 (TAT 6-24 HRS)  LIPASE, BLOOD  BRAIN NATRIURETIC PEPTIDE  VITAMIN B1  EKG  EKG Interpretation  Date/Time:  Thursday October 16 2019 01:44:07 EST Ventricular Rate:  75 PR Interval:    QRS Duration: 84 QT Interval:  392 QTC Calculation: 438 R Axis:   54 Text Interpretation: Sinus rhythm Atrial premature complexes Otherwise no significant change Confirmed by Addison Lank [phone]) on 10/16/2019 1:45:50 AM      Radiology DG Chest Port 1 View  Result Date: 10/16/2019 CLINICAL DATA:  Epigastric pain EXAM: PORTABLE CHEST 1 VIEW COMPARISON:  Radiograph 05/27/2016 FINDINGS: There is a large left pleural effusion with overall increased attenuation throughout the left hemithorax likely reflecting some layering pleural fluid posteriorly as well. Dense opacities are present in the left lung which likely reflect atelectatic change but with some more focally consolidative perihilar density. Some patchy opacities are present in the right upper lung as well. Much of the left heart border is obscured by overlying opacity though the overall size of the cardiac silhouette appears increased from comparison portable radiograph. No visible pneumothorax. No acute osseous or soft tissue abnormality. Degenerative changes are present in the imaged spine and shoulders. Telemetry leads overlie the chest. IMPRESSION: Large left pleural effusion with some hazy interstitial opacities throughout the lungs, could reflect edema possibly related to CHF or volume overload. More consolidative opacity in the left mid lung and right apex raise could reflect either developing alveolar edema or infection in the appropriate clinical setting. Electronically Signed   By: Lovena Le M.D.   On: 10/16/2019 00:33    Pertinent labs & imaging results that were available during my care of the patient were reviewed by me and considered in my medical decision making (see chart for details).  Medications Ordered in ED Medications  sodium chloride (PF) 0.9 % injection (has no administration in time range)   sodium chloride 0.9 % bolus 500 mL (0 mLs Intravenous Stopped 10/16/19 0147)  alum & mag hydroxide-simeth (MAALOX/MYLANTA) 200-200-20 MG/5ML suspension 30 mL (30 mLs Oral Given 10/16/19 0035)  thiamine (B-1) injection 100 mg (100 mg Intravenous Given 10/16/19 0216)  furosemide (LASIX) injection 20 mg (20 mg Intravenous Given 10/16/19 0310)                                                                                                                                    Procedures Procedures  (including critical care time)  Medical Decision Making / ED Course I have reviewed the nursing notes for this encounter and the patient's prior records (if available in EHR or on provided paperwork).   Sydney Jordan was evaluated in Emergency Department on 10/16/2019 for the symptoms described in the history of present illness. She was evaluated in the context of the global COVID-19 pandemic, which necessitated consideration that the patient might be at risk for infection with the SARS-CoV-2 virus that causes COVID-19. Institutional protocols and algorithms that pertain to the evaluation of patients at risk for COVID-19 are in a  state of rapid change based on information released by regulatory bodies including the CDC and federal and state organizations. These policies and algorithms were followed during the patient's care in the ED.    Clinical Course as of Oct 15 421  Thu Oct 16, 2019  0010 Elderly female presents for anorexia and left upper extremity swelling.  On review of system patient has had history of peptic ulcers and dysphagia.  Patient does not have a primary care provider with whom she follows up with.  No reported infectious symptoms.  Patient reports being in a longtime smoker.  No known history of cancer.  Lungs with fine rales on exam.  Given the patient's decreased oral intake and reported anorexia with rales and peripheral edema, and initial concern for wet beriberi.  Possible  severe protein deficiency.  Given her smoking history, will also like to obtain chest x-ray to assess for pulmonary nodules/Pancoast tumor.  This will also provide imaging to assess for pulmonary edema and see cardiac silhouette.   We will obtain screening labs as well.    [PC]  4854 Chest x-ray notable for cardiomegaly and left pleural effusion.  Right apical opacities.  Difficult to assess left apex due to the effusion.   [PC]  0230 Labs with mild leukocytosis.  No significant anemia.  No significant electrolyte derangements or renal sufficiency.  Protein within normal limits.  No evidence of bili obstruction or pancreatitis.   [PC]  0423 Case discussed with medicine for continued work-up and management.  Patient was provided with thiamine and Lasix given the cardiomegaly and pleural effusion.    [PC]    Clinical Course User Index [PC] Dewane Timson, Grayce Sessions, MD     Final Clinical Impression(s) / ED Diagnoses Final diagnoses:  Epigastric abdominal pain  Adult failure to thrive  Pleural effusion  Arm edema      This chart was dictated using voice recognition software.  Despite best efforts to proofread,  errors can occur which can change the documentation meaning.   Fatima Blank, MD 10/16/19 [phone]

## 2019-10-16 NOTE — ED Notes (Signed)
Pt. Placed on purewick.  

## 2019-10-16 NOTE — Progress Notes (Signed)
Initial Nutrition Assessment  DOCUMENTATION CODES:   Underweight  INTERVENTION:  - will order Boost Breeze once/day, each supplement provides 250 kcal and 9 grams of protein. - will order Ensure Enlive BID, each supplement provides 350 kcal and 20 grams of protein. - will order Magic Cup BID with meals, each supplement provides 290 kcal and 9 grams of protein. - will order daily multivitamin with minerals. - continue to encourage PO intakes.    NUTRITION DIAGNOSIS:   Increased nutrient needs related to acute illness as evidenced by estimated needs.  GOAL:   Patient will meet greater than or equal to 90% of their needs  MONITOR:   PO intake, Supplement acceptance, Labs, Weight trends  REASON FOR ASSESSMENT:   Malnutrition Screening Tool  ASSESSMENT:   86 y.o. female with history of tobacco abuse (continues to smoke), HTN, and PUD with hx of perforation. She presented to the ED due to increasing swelling of L upper arm. She reported weight loss over the past few weeks and SOB x5 days PTA.  Unable to talk with patient at this time as she is out of the room. No intakes documented since admission.   Per chart review, weight today is 89 lb. PTA the most recently documented weight was on 11/24/16 when she weighed 102 lb. This indicates 13 lb weight loss (13% body weight) in the past ~3 years; not significant for time frame but unable to determine if weight loss occurred more acutely. Suspect some degree of malnutrition given current BMI.   Per notes: - L-sided lung mass with possible metastatic lesions--Pulmonary c/s - post-obstructive PNA - L-sided pleural effusion with ultrasound-guided thoracentesis today (1/28) - cachexia thought to be 2/2 metastatic lesion   Labs reviewed; Cl: 97 mmol/l, BUN: 24 mg/dl, GFR: 51 ml/min. Medications reviewed; 20 mg IV lasix x1 dose 1/28, 40 mg oral protonix/day, 100 mg IV thiamine x1 dose 1/28.    NUTRITION - FOCUSED PHYSICAL EXAM:  unable  to complete at this time.   Diet Order:   Diet Order            Diet regular Room service appropriate? Yes; Fluid consistency: Thin  Diet effective now              EDUCATION NEEDS:   No education needs have been identified at this time  Skin:  Skin Assessment: Reviewed RN Assessment  Last BM:  1/24  Height:   Ht Readings from Last 1 Encounters:  10/16/19 5\' 4"  (1.626 m)    Weight:   Wt Readings from Last 1 Encounters:  10/16/19 40.3 kg    Ideal Body Weight:  54.5 kg  BMI:  Body mass index is 15.25 kg/m.  Estimated Nutritional Needs:   Kcal:  1410-1615 kcal  Protein:  70-80 grams  Fluid:  >/= 1.6 L/day     Jarome Matin, MS, RD, LDN, [facility] Inpatient Clinical Dietitian Pager # [phone] After hours/weekend pager # [phone]

## 2019-10-16 NOTE — ED Notes (Signed)
Pt transported to CT ?

## 2019-10-16 NOTE — H&P (Addendum)
History and Physical    Sydney Jordan HGD:[id number] DOB: 31-Aug-1930 DOA: 10/15/2019  PCP: Patient, No Pcp Per  Patient coming from: Home.  Chief Complaint: Left arm swelling.  HPI: Sydney Jordan is a 86 y.o. female with history of tobacco abuse hypertension peptic ulcer disease with perforation present not take any medication still smokes cigarettes presents to the ER because of increasing swelling of the left upper arm.  Patient states she also has been having weight loss over the last few weeks with shortness of breath last 5 days.  Denies any fever chills chest pain nausea vomiting diarrhea.  Denies any Covid contacts.  ED Course: In the ER patient was afebrile.  Chest x-ray was showing left-sided pleural effusion with EGS capacities.  EKG shows normal sinus with atrial premature complexes.  Labs show leukocytosis of 12.9 albumin 3.4 Covid test is pending.  CT chest with contrast shows large left pleural effusion with left upper lobe mass concerning for malignancies with metastatic lesions compression of the pulmonary vessels.  Also shows postobstructive pneumonia.  Multiple nodules.  Patient admitted for further management.  Review of Systems: As per HPI, rest all negative.   Past Medical History:  Diagnosis Date  . Arthritis   . Cholelithiasis   . GERD (gastroesophageal reflux disease)   . Hypertension   . Multiple gastric ulcers     Past Surgical History:  Procedure Laterality Date  . CHOLECYSTECTOMY    . ESOPHAGOGASTRODUODENOSCOPY (EGD) WITH PROPOFOL N/A 11/30/2016   Procedure: ESOPHAGOGASTRODUODENOSCOPY (EGD) WITH PROPOFOL;  Surgeon: Milus Banister, MD;  Location: [facility];  Service: Endoscopy;  Laterality: N/A;  . LAPAROTOMY N/A 08/26/2016   Procedure: EXPLORATORY LAPAROTOMY, PRIMARY CLOSURE OF PERFORATED PYLORIC ULCER WITH OMENTAL PATCH, OPEN CHOLECYSTECTOMY;  Surgeon: Jackolyn Confer, MD;  Location: [facility];  Service: General;  Laterality: N/A;     reports that she  has been smoking. She has a 32.50 pack-year smoking history. She has never used smokeless tobacco. She reports that she does not drink alcohol or use drugs.  No Known Allergies  Family History  Problem Relation Age of Onset  . CAD Neg Hx     Prior to Admission medications   Medication Sig Start Date End Date Taking? Authorizing Provider  feeding supplement, ENSURE ENLIVE, (ENSURE ENLIVE) LIQD Take 237 mLs by mouth 2 (two) times daily between meals. 09/01/16  Yes Meuth, Brooke A, PA-C  amLODipine (NORVASC) 5 MG tablet Take 1 tablet (5 mg total) by mouth daily. Patient not taking: Reported on 10/15/2019 09/01/16   Margie Billet A, PA-C  Multiple Vitamin (MULTIVITAMIN WITH MINERALS) TABS tablet Take 1 tablet by mouth daily. Patient not taking: Reported on 10/15/2019 09/01/16   Margie Billet A, PA-C  pantoprazole (PROTONIX) 40 MG tablet Take 1 tablet (40 mg total) by mouth 2 (two) times daily. Patient not taking: Reported on 10/15/2019 08/29/19   Arturo Morton    Physical Exam: Constitutional: Moderately built and nourished. Vitals:   10/16/19 0330 10/16/19 0400 10/16/19 0509 10/16/19 0521  BP: 119/65 130/79  129/77  Pulse: 70 78  75  Resp: 17 16  18   Temp:      TempSrc:      SpO2: 91% 92%  90%  Weight:   40.3 kg   Height:   5\' 4"  (1.626 m)    Eyes: Anicteric no pallor.  Left-sided ptosis. ENMT: No discharge from the ears eyes nose or mouth. Neck: No mass or.  No neck rigidity.  Respiratory: No rhonchi or crepitations. Cardiovascular: S1-S2 heard. Abdomen: Soft nontender bowel sounds present. Musculoskeletal: Left upper extremity is swollen. Skin: No rash. Neurologic: Alert awake oriented time place and person.  Has left-sided ptosis but moves all extremities. Psychiatric: Appears normal.   Labs on Admission: I have personally reviewed following labs and imaging studies  CBC: Recent Labs  Lab 10/16/19 0004  WBC 12.9*  NEUTROABS 11.2*  HGB 12.8  HCT 40.5  MCV 73.5*   PLT 580   Basic Metabolic Panel: Recent Labs  Lab 10/16/19 0004  NA 141  K 4.0  CL 97*  CO2 24  GLUCOSE 105*  BUN 24*  CREATININE 0.99  CALCIUM 9.7   GFR: Estimated Creatinine Clearance: 25.5 mL/min (by C-G formula based on SCr of 0.99 mg/dL). Liver Function Tests: Recent Labs  Lab 10/16/19 0004  AST 31  ALT 15  ALKPHOS 61  BILITOT 1.3*  PROT 7.9  ALBUMIN 3.4*   Recent Labs  Lab 10/16/19 0004  LIPASE 22   No results for input(s): AMMONIA in the last 168 hours. Coagulation Profile: No results for input(s): INR, PROTIME in the last 168 hours. Cardiac Enzymes: No results for input(s): CKTOTAL, CKMB, CKMBINDEX, TROPONINI in the last 168 hours. BNP (last 3 results) No results for input(s): PROBNP in the last 8760 hours. HbA1C: No results for input(s): HGBA1C in the last 72 hours. CBG: No results for input(s): GLUCAP in the last 168 hours. Lipid Profile: No results for input(s): CHOL, HDL, LDLCALC, TRIG, CHOLHDL, LDLDIRECT in the last 72 hours. Thyroid Function Tests: No results for input(s): TSH, T4TOTAL, FREET4, T3FREE, THYROIDAB in the last 72 hours. Anemia Panel: No results for input(s): VITAMINB12, FOLATE, FERRITIN, TIBC, IRON, RETICCTPCT in the last 72 hours. Urine analysis:    Component Value Date/Time   COLORURINE AMBER (A) 12/20/2013 1311   APPEARANCEUR CLEAR 12/20/2013 1311   LABSPEC 1.027 12/20/2013 1311   PHURINE 6.5 12/20/2013 1311   GLUCOSEU NEGATIVE 12/20/2013 1311   HGBUR NEGATIVE 12/20/2013 1311   BILIRUBINUR SMALL (A) 12/20/2013 1311   KETONESUR 15 (A) 12/20/2013 1311   PROTEINUR 30 (A) 12/20/2013 1311   UROBILINOGEN 1.0 12/20/2013 1311   NITRITE NEGATIVE 12/20/2013 1311   LEUKOCYTESUR NEGATIVE 12/20/2013 1311   Sepsis Labs: @LABRCNTIP (procalcitonin:4,lacticidven:4) )No results found for this or any previous visit (from the past 240 hour(s)).   Radiological Exams on Admission: CT CHEST W CONTRAST  Result Date: 10/16/2019 CLINICAL  DATA:  Abnormal chest x-ray, large pleural effusion, question lung nodules EXAM: CT CHEST WITH CONTRAST TECHNIQUE: Multidetector CT imaging of the chest was performed during intravenous contrast administration. CONTRAST:  37mL OMNIPAQUE IOHEXOL 300 MG/ML  SOLN COMPARISON:  Same day chest radiograph FINDINGS: Cardiovascular: Rightward mediastinal shift. Normal cardiac size. No pericardial effusion. Extensive coronary artery calcifications are noted, likely with prior coronary stenting. Central pulmonary arteries are normal caliber. No large central filling defects on this non tailored examination. There is abrupt tapering of the pulmonary arteries to the lingula and left upper lobe. Atherosclerotic plaque within the normal caliber aorta. Normal branching of the aortic arch. Mild calcification of the proximal great vessels. Mediastinum/Nodes: Rightward mediastinal shift secondary to a large volume positive process in the left hemithorax. Nodule seen in the mid trachea (2/21), could reflect a tracheal mass versus adherent secretions. Additional web-like secretions are noted more distally. There is abrupt truncation of many of the central airways entering the left lung as well including complete truncation of the left upper lobe and lingular airways  and severe narrowing of the left lower lobe airways. Extensive bilateral mediastinal and hilar adenopathy is noted. Instance a 2.2 cm subcarinal nodal mass (2/53), and a 1.2 cm right hilar node (2/52) there is direct involvement of the left hilum from the extensive masslike process extending throughout the left upper lobe which also appears to have indistinct margins with the anterior mediastinal borders extending into the prevascular space and AP window. Lungs/Pleura: There is complete consolidation of the left upper lobe with internal heterogeneity and irregular lobular margins suggesting mass infiltration extending into or from the left hilum as well extension to the  anterior mediastinum as well. There is partial atelectatic collapse of the left lower lung with numerous nodular masslike opacities. Largest measures 2.6 cm in size and directly narrows the hilar vessels and airways (2/68). Some patchy ground-glass and tree-in-bud opacities in residually aerated left lung base may reflect areas of postobstructive infection or inflammation. Large left pleural effusion is noted as well with possible loculation towards the lung apex. There are numerous spiculated nodules throughout the right lung as well occluding the largest in the right apex measuring up to 2.8 cm (2/19). Upper Abdomen: No acute abnormalities present in the visualized portions of the upper abdomen. Musculoskeletal: Multilevel degenerative changes are present in the imaged portions of the spine. No acute osseous abnormality or suspicious osseous lesion. Motion artifact and demineralization of the bones may limit detection of subtle nondisplaced fractures or anomalies. IMPRESSION: 1. Extensive masslike soft tissue density with infiltration extending throughout the left hilum and anterior mediastinum and involving the completely consolidated left upper lobe concerning for primary site of malignancy. 2. Numerous spiculated nodules throughout the aerated portions of the left lung and throughout the right lung as well compatible with metastatic disease. 3. Extensive bilateral mediastinal and hilar adenopathy compatible with metastatic disease. 4. Large left pleural effusion with some lobular margins worrisome for malignant effusion with loculation. 5. Abrupt tapering of the pulmonary arteries to the lingula and left upper lobe likely secondary to extrinsic compression from the extensive masslike process in the left upper lobe. 6. Some ground-glass in the residual aerated portions of the left lower lobe concerning for postobstructive pneumonia. 7. Nodular projection in the trachea could reflect adherent secretions versus  indeterminate polypoid nodule. 8. Extensive coronary artery calcifications likely with prior coronary stenting. 9.  Aortic Atherosclerosis (ICD10-I70.0). Critical Value/emergent results were called by telephone at the time of interpretation on 10/16/2019 at 5:01 am to provider [facility] , who verbally acknowledged these results. Electronically Signed   By: Lovena Le M.D.   On: 10/16/2019 05:00   DG Chest Port 1 View  Result Date: 10/16/2019 CLINICAL DATA:  Epigastric pain EXAM: PORTABLE CHEST 1 VIEW COMPARISON:  Radiograph 05/27/2016 FINDINGS: There is a large left pleural effusion with overall increased attenuation throughout the left hemithorax likely reflecting some layering pleural fluid posteriorly as well. Dense opacities are present in the left lung which likely reflect atelectatic change but with some more focally consolidative perihilar density. Some patchy opacities are present in the right upper lung as well. Much of the left heart border is obscured by overlying opacity though the overall size of the cardiac silhouette appears increased from comparison portable radiograph. No visible pneumothorax. No acute osseous or soft tissue abnormality. Degenerative changes are present in the imaged spine and shoulders. Telemetry leads overlie the chest. IMPRESSION: Large left pleural effusion with some hazy interstitial opacities throughout the lungs, could reflect edema possibly related to CHF or  volume overload. More consolidative opacity in the left mid lung and right apex raise could reflect either developing alveolar edema or infection in the appropriate clinical setting. Electronically Signed   By: Lovena Le M.D.   On: 10/16/2019 00:33    EKG: Independently reviewed.  Normal sinus rhythm with APCs.  Assessment/Plan Active Problems:   Tobacco abuse   Pleural effusion   Lung mass   Pneumonia    1. Left-sided lung mass with possible metastatic lesions will need pulmonary consult.   For now we will order thoracentesis for the left pleural effusion. 2. Postobstructive pneumonia for which patient is placed on empiric antibiotics.  Follow Covid test. 3. Left pleural effusion I have ordered ultrasound-guided thoracentesis. 4. History of hypertension presently not on any medications.  We will keep patient n.p.o. on IV hydralazine. 5. History of peptic ulcer disease status post perforation previously requiring surgery not on any medication keep patient on PPI for now. 6. Tobacco abuse -advised about quitting. 7. 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 8. Left upper extremity edema likely from vascular compression from the tumor will get Dopplers to rule out DVT. 9. Cachexia likely from metastatic lesion.  Will need nutrition input.  Given that patient has left lung mass with loculated pleural effusion with likely metastatic lesion will need close monitoring for any deterioration in inpatient status.  Covid test is pending.   DVT prophylaxis: SCDs anticipation of procedure. Code Status: DNR. Family Communication: Discussed with patient. Disposition Plan: Home. Consults called: None. Admission status: Inpatient.   Rise Patience MD [facility] Pager [phone].  If 7PM-7AM, please contact night-coverage [email or web address] Password [facility]  10/16/2019, 6:36 AM

## 2019-10-16 NOTE — Progress Notes (Signed)
Pharmacy Antibiotic Note  Sydney Jordan is a 86 y.o. female admitted on 10/15/2019 with pneumonia.  Pharmacy has been consulted for vancomycin & cefepime dosing.  Plan: Cefepime 2 gm IV q12 Vancomycin 1 gm loading dose  Vancomycin 750 mg IV Q 48 hrs. Goal AUC 400-550. Expected AUC: 506 SCr used: 0.99, TBW as TBW > IBW F/u renal fxn, WBC, temp, culture data  Height: 5\' 4"  (162.6 cm) Weight: 88 lb 13.5 oz (40.3 kg) IBW/kg (Calculated) : 54.7  Temp (24hrs), Avg:97.6 F (36.4 C), Min:97.6 F (36.4 C), Max:97.6 F (36.4 C)  Recent Labs  Lab 10/16/19 0004  WBC 12.9*  CREATININE 0.99    Estimated Creatinine Clearance: 25.5 mL/min (by C-G formula based on SCr of 0.99 mg/dL).    No Known Allergies  Antimicrobials this admission: 1/28 vanc>> 1/28 cefepime>> Dose adjustments this admission:  Microbiology results: 1/28 Covid: ip 1/28 MRSA PCR: ordered  Thank you for allowing pharmacy to be a part of this patient's care.  Eudelia Bunch, Pharm.D 10/16/2019 7:36 AM

## 2019-10-16 NOTE — Procedures (Signed)
PROCEDURE SUMMARY:  Successful image-guided left thoracentesis. Yielded 400 milliliters of clear gold fluid. Procedure was stopped after 400 mL due to patient's chest discomfort. Patient tolerated procedure well. No immediate complications. EBL = 0 mL.  Specimen was sent for labs. CXR ordered.  Please see imaging section of Epic for full dictation.   Earley Abide PA-C 10/16/2019 3:43 PM

## 2019-10-16 NOTE — Progress Notes (Signed)
Patient seen and examined.  Also called patient's grandson who came to the hospital and discussed with him at the bedside.  Admitted early morning hours by nighttime hospitalist.  86 year old female with no significant medical issues, lifelong smoker who had been having poor appetite and weight loss for about 3 months now, she had gradual difficulty swallowing and difficulty to speak for about 2 to 3 weeks now.  In the emergency room, she obviously has left arm swelling, cachectic.  She has difficulty swallowing.  Chest x-ray showed left sided pleural effusion. A CT scan of the chest showed extensive parenchymal metastatic lesions, multiple metastatic lesion on the both side of the lungs, mediastinal lymph nodes, postobstructive pneumonia left upper lobe, pleural effusion left side. Given patient's clinical history and radiological presentation this most likely metastatic lung cancer. I showed CT scan pictures to patient's grandson at the bedside and explained to him about possible diagnosis. Patient is a schedule for ultrasound-guided thoracentesis, cytology of the fluid. Patient will benefit with palliative consult, possibly hospice.  Cytology will be helpful. Consult speech, her dysphagia is probably due to extrinsic compression.

## 2019-10-16 NOTE — ED Notes (Addendum)
Assisted patient to bedside commode per MD. Patient stated "what now" when sitting on commode. Patient was told she could urinate now but patient stated she did not have to pee. Patient assisted back to bed and hooked back up to monitor when she stated she had to pee again and went on the incontinence pads. Pt cleaned and purewick applied.

## 2019-10-17 ENCOUNTER — Inpatient Hospital Stay (HOSPITAL_COMMUNITY): Payer: Medicare Other

## 2019-10-17 DIAGNOSIS — Z7189 Other specified counseling: Secondary | ICD-10-CM

## 2019-10-17 DIAGNOSIS — Z9889 Other specified postprocedural states: Secondary | ICD-10-CM

## 2019-10-17 DIAGNOSIS — J3801 Paralysis of vocal cords and larynx, unilateral: Secondary | ICD-10-CM

## 2019-10-17 DIAGNOSIS — R627 Adult failure to thrive: Secondary | ICD-10-CM

## 2019-10-17 DIAGNOSIS — R918 Other nonspecific abnormal finding of lung field: Secondary | ICD-10-CM

## 2019-10-17 DIAGNOSIS — Z515 Encounter for palliative care: Secondary | ICD-10-CM

## 2019-10-17 DIAGNOSIS — R609 Edema, unspecified: Secondary | ICD-10-CM

## 2019-10-17 DIAGNOSIS — Z66 Do not resuscitate: Secondary | ICD-10-CM

## 2019-10-17 DIAGNOSIS — J90 Pleural effusion, not elsewhere classified: Secondary | ICD-10-CM

## 2019-10-17 DIAGNOSIS — I82629 Acute embolism and thrombosis of deep veins of unspecified upper extremity: Secondary | ICD-10-CM | POA: Diagnosis present

## 2019-10-17 LAB — ACID FAST SMEAR (AFB, MYCOBACTERIA): Acid Fast Smear: NEGATIVE

## 2019-10-17 LAB — TRIGLYCERIDES, BODY FLUIDS: Triglycerides, Fluid: 22 mg/dL

## 2019-10-17 LAB — PROTIME-INR
INR: 1.1 (ref 0.8–1.2)
Prothrombin Time: 14.5 seconds (ref 11.4–15.2)

## 2019-10-17 LAB — PH, BODY FLUID: pH, Body Fluid: 7.7

## 2019-10-17 MED ORDER — SODIUM CHLORIDE 0.9 % IV SOLN
2.0000 g | INTRAVENOUS | Status: DC
  Filled 2019-10-17: qty 2

## 2019-10-17 MED ORDER — SODIUM CHLORIDE 0.9 % IV SOLN: INTRAVENOUS | Status: DC

## 2019-10-17 MED ORDER — LORAZEPAM 2 MG/ML IJ SOLN: 0.5000 mg | Freq: Four times a day (QID) | INTRAMUSCULAR | Status: DC | PRN

## 2019-10-17 MED ORDER — MORPHINE SULFATE (PF) 2 MG/ML IV SOLN
2.0000 mg | INTRAVENOUS | Status: DC | PRN
  Administered 2019-10-17: 22:00:00 2 mg via INTRAVENOUS
  Filled 2019-10-17: qty 1

## 2019-10-17 NOTE — Progress Notes (Signed)
PHARMACY NOTE -  Cefepime  Pharmacy has been assisting with dosing of cefepime for postobstructive PNA.  Will renally adjust cefepime to 2g IV q24 hr given SCr at baseline  Pharmacy will sign off, following peripherally for culture results or dose adjustments. Please reconsult if a change in clinical status warrants re-evaluation of dosage.  Reuel Boom, PharmD, BCPS [phone] 10/17/2019, 11:52 AM

## 2019-10-17 NOTE — Plan of Care (Signed)
  Problem: Education: Goal: Knowledge of General Education information will improve Description Including pain rating scale, medication(s)/side effects and non-pharmacologic comfort measures Outcome: Progressing   

## 2019-10-17 NOTE — Progress Notes (Signed)
Venous Doppler report : Preliminary finding Left upper extremity positive for DVT. MD made aware. SRP, RN

## 2019-10-17 NOTE — Progress Notes (Signed)
PROGRESS NOTE    Sydney Jordan  UKG:[id number] DOB: [birth year] DOA: 10/15/2019 PCP: Patient, No Pcp Per    Brief Narrative:  86 year old female with no significant medical issues, history of H. pylori associated ulcer, lifelong smoker who had been having poor appetite and weight loss for about 3 months now, she had gradual difficulty swallowing and difficulty to speak for about 2 to 3 weeks now. In the emergency room hemodynamically stable. Patient was found with extensive metastatic lung cancer.   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 Also with dysphagia and dysphonia suspect paralyzes of both cords. Currently hemodynamically stable. On room air. Thoracentesis October 16, 2019, exudate with 290 nucleated cells consistent with malignancy. Cytology pending. On vancomycin and cefepime for postobstructive pneumonia, less likely helpful. Discontinue vancomycin continue cefepime. Seen by pulmonology, if tissue biopsy cannot be made they are planning to do bronc biopsy. Given patient's age, probable stage IIIb or stage IV lung cancer she is less likely to tolerate any chemotherapy or radiation therapy as palliation. Palliative care consultation, a good home hospice candidate with good home support if agreed by family, pending biopsy results for tissue diagnosis. I met patient's grandson at the bedside and showed him scans and explained in details.  Dysphagia/severe protein calorie malnutrition: Cancer related cachexia and also suspect obstruction. Will have a speech evaluation. Keep  n.p.o. until then.  Left upper extremity multiple vein DVT: No PE. Upper extremity DVT. Will need anticoagulation. Wait until any invasive procedures needed.  DVT prophylaxis: SCDs Code Status: DNR Family Communication: Patient's grandson at the bedside, 1/28. Will call /meet again today. Disposition Plan: patient is from home. Anticipated DC to home, Barriers to discharge unable to eat. Diagnosis pending.   Consultants:   Pulmonary  Palliative  Procedures:   Thoracentesis, 1/28, 400 mL clear fluid no growth  Antimicrobials:  Anti-infectives (From admission, onward)   Start     Dose/Rate Route Frequency Ordered Stop   10/18/19 1000  vancomycin (VANCOREADY) IVPB 750 mg/150 mL  Status:  Discontinued     750 mg 150 mL/hr over 60 Minutes Intravenous Every 48 hours 10/16/19 0735 10/17/19 1124   10/16/19 0830  vancomycin (VANCOCIN) IVPB 1000 mg/200 mL premix     1,000 mg 200 mL/hr over 60 Minutes Intravenous  Once 10/16/19 0710 10/16/19 1128   10/16/19 0800  piperacillin-tazobactam (ZOSYN) IVPB 3.375 g  Status:  Discontinued     3.375 g 12.5 mL/hr over 240 Minutes Intravenous Every 8 hours 10/16/19 0710 10/16/19 0729   10/16/19 0800  ceFEPIme (MAXIPIME) 2 g in sodium chloride 0.9 % 100 mL IVPB     2 g 200 mL/hr over 30 Minutes Intravenous Every 12 hours 10/16/19 0729           Subjective: Patient seen and examined. No overnight events. She herself denies any complaints. She was worried about unable to talk and difficulty swallowing and unable to eat food. No cough.  Objective: Vitals:   10/16/19 1512 10/16/19 1610 10/16/19 2007 10/17/19 0525  BP: 115/75 110/69 121/67 112/64  Pulse:  68 72 69  Resp:  '18 18 18  ' Temp:  97.7 F (36.5 C) 97.9 F (36.6 C) 97.8 F (36.6 C)  TempSrc:  Oral Oral   SpO2: 93% 92% 91% 93%  Weight:      Height:        Intake/Output Summary (Last 24 hours) at 10/17/2019 1126 Last data filed at 10/17/2019 0500 Gross per 24 hour  Intake 200 ml   Output 150 ml  Net 50 ml   Filed Weights   10/16/19 0509  Weight: 40.3 kg    Examination:  General exam: Appears calm and comfortable, cachectic, on room air. Respiratory system: Clear to auscultation. Respiratory effort normal. No air entry on the left side. Cardiovascular system: S1 & S2 heard, RRR. No JVD, murmurs, rubs, gallops or clicks. No pedal edema. Gastrointestinal system: Abdomen is nondistended, soft and nontender. No organomegaly or masses felt. Normal bowel sounds heard. Central nervous system: Alert and oriented. No focal neurological deficits. Extremities: Symmetric 5 x 5 power. Skin: No rashes, lesions or ulcers Psychiatry: Judgement and insight appear normal. Mood & affect appropriate.   Left arm is swollen, nontender, swollen throughout.    Data Reviewed: I have personally reviewed following labs and imaging studies  CBC: Recent Labs  Lab 10/16/19 0004  WBC 12.9*  NEUTROABS 11.2*  HGB 12.8  HCT 40.5  MCV 73.5*  PLT 086   Basic Metabolic Panel: Recent Labs  Lab 10/16/19 0004  NA 141  K 4.0  CL 97*  CO2 24  GLUCOSE 105*  BUN 24*  CREATININE 0.99  CALCIUM 9.7   GFR: Estimated Creatinine Clearance: 25.5 mL/min (by C-G formula based on SCr of 0.99 mg/dL). Liver Function Tests: Recent Labs  Lab 10/16/19 0004  AST 31  ALT 15  ALKPHOS 61  BILITOT 1.3*  PROT 7.9  ALBUMIN 3.4*   Recent Labs  Lab 10/16/19 0004  LIPASE 22   No results for input(s): AMMONIA in the last 168 hours. Coagulation Profile: Recent Labs  Lab 10/17/19 0339  INR 1.1   Cardiac Enzymes: No results for input(s): CKTOTAL, CKMB, CKMBINDEX, TROPONINI in the last 168 hours. BNP (last 3 results) No results for input(s): PROBNP in the last 8760 hours. HbA1C: No results for input(s): HGBA1C in the last 72 hours. CBG: No results for input(s): GLUCAP in the last 168 hours. Lipid Profile: No results for input(s): CHOL, HDL, LDLCALC, TRIG, CHOLHDL, LDLDIRECT in the  last 72 hours. Thyroid Function Tests: No results for input(s): TSH, T4TOTAL, FREET4, T3FREE, THYROIDAB in the last 72 hours. Anemia Panel: No results for input(s): VITAMINB12, FOLATE, FERRITIN, TIBC, IRON, RETICCTPCT in the last 72 hours. Sepsis Labs: No results for input(s): PROCALCITON, LATICACIDVEN in the last 168 hours.  Recent Results (from the past 240 hour(s))  SARS CORONAVIRUS 2 (TAT 6-24 HRS) Nasopharyngeal Nasopharyngeal Swab     Status: None   Collection Time: 10/16/19  1:47 AM   Specimen: Nasopharyngeal Swab  Result Value Ref Range Status   SARS Coronavirus 2 NEGATIVE NEGATIVE Final    Comment: (NOTE) SARS-CoV-2 target nucleic acids are NOT DETECTED. The SARS-CoV-2 RNA is generally detectable in upper and lower respiratory specimens during the acute phase of infection. Negative results do not preclude SARS-CoV-2 infection, do not rule out co-infections with other pathogens, and should not be used as the sole basis for treatment or other patient management decisions. Negative results must be combined with clinical observations, patient history, and epidemiological information. The expected result is Negative. Fact Sheet for Patients: SugarRoll.be Fact  Sheet for Healthcare Providers: https://www.woods-mathews.com/ This test is not yet approved or cleared by the [place] FDA and  has been authorized for detection and/or diagnosis of SARS-CoV-2 by FDA under an Emergency Use Authorization (EUA). This EUA will remain  in effect (meaning this test can be used) for the duration of the COVID-19 declaration under Section 56 4(b)(1) of the Act, 21 U.S.C. section 360bbb-3(b)(1), unless the authorization is terminated or revoked sooner. Performed at [facility], [place] [address] [place], [place] [address]   MRSA PCR Screening     Status: None   Collection Time: 10/16/19  1:55 PM   Specimen: Nasopharyngeal  Result Value Ref  Range Status   MRSA by PCR NEGATIVE NEGATIVE Final    Comment:        The GeneXpert MRSA Assay (FDA approved for NASAL specimens only), is one component of a comprehensive MRSA colonization surveillance program. It is not intended to diagnose MRSA infection nor to guide or monitor treatment for MRSA infections. Performed at [facility], [place] [address] [place], [place] [address]   Body fluid culture     Status: None (Preliminary result)   Collection Time: 10/16/19  3:45 PM   Specimen: PATH Cytology Pleural fluid  Result Value Ref Range Status   Specimen Description   Final    FLUID LEFT PLEURAL Performed at [facility], [place] [address], [place], [place] [address]    Special Requests   Final    NONE Performed at [facility], [place] [address] [place], [place] [address]    Gram Stain   Final    RARE WBC PRESENT,BOTH PMN AND MONONUCLEAR NO ORGANISMS SEEN    Culture   Final    NO GROWTH < 24 HOURS Performed at [facility], [place] [address], [place], [place] [address]    Report Status PENDING  Incomplete         Radiology Studies: CT CHEST W CONTRAST  Result Date: 10/16/2019 CLINICAL DATA:  Abnormal chest x-ray, large pleural effusion, question lung nodules EXAM: CT CHEST WITH CONTRAST TECHNIQUE: Multidetector CT imaging of the chest was performed during intravenous contrast administration. CONTRAST:  17m OMNIPAQUE IOHEXOL 300 MG/ML  SOLN COMPARISON:  Same day chest radiograph FINDINGS: Cardiovascular: Rightward mediastinal shift. Normal cardiac size. No pericardial effusion. Extensive coronary artery calcifications are noted, likely with prior coronary stenting. Central pulmonary arteries are normal caliber. No large central filling defects on this non tailored examination. There is abrupt tapering of the pulmonary arteries to the lingula and left upper lobe. Atherosclerotic plaque within the normal caliber aorta. Normal  branching of the aortic arch. Mild calcification of the proximal great vessels. Mediastinum/Nodes: Rightward mediastinal shift secondary to a large volume positive process in the left hemithorax. Nodule seen in the mid trachea (2/21), could reflect a tracheal mass versus adherent secretions. Additional web-like secretions are noted more distally. There is abrupt truncation of many of the central airways entering the left lung as well including complete truncation of the left upper lobe and lingular airways and severe narrowing of the left lower lobe airways. Extensive bilateral mediastinal and hilar adenopathy is noted. Instance a 2.2 cm subcarinal nodal mass (2/53), and a 1.2 cm right hilar node (2/52) there is direct involvement of the left hilum from the extensive masslike process extending throughout the left upper lobe which also appears to have indistinct margins with the anterior mediastinal borders extending into the prevascular space and  AP window. Lungs/Pleura: There is complete consolidation of the left upper lobe with internal heterogeneity and irregular lobular margins suggesting mass infiltration extending into or from the left hilum as well extension to the anterior mediastinum as well. There is partial atelectatic collapse of the left lower lung with numerous nodular masslike opacities. Largest measures 2.6 cm in size and directly narrows the hilar vessels and airways (2/68). Some patchy ground-glass and tree-in-bud opacities in residually aerated left lung base may reflect areas of postobstructive infection or inflammation. Large left pleural effusion is noted as well with possible loculation towards the lung apex. There are numerous spiculated nodules throughout the right lung as well occluding the largest in the right apex measuring up to 2.8 cm (2/19). Upper Abdomen: No acute abnormalities present in the visualized portions of the upper abdomen. Musculoskeletal: Multilevel degenerative changes  are present in the imaged portions of the spine. No acute osseous abnormality or suspicious osseous lesion. Motion artifact and demineralization of the bones may limit detection of subtle nondisplaced fractures or anomalies. IMPRESSION: 1. Extensive masslike soft tissue density with infiltration extending throughout the left hilum and anterior mediastinum and involving the completely consolidated left upper lobe concerning for primary site of malignancy. 2. Numerous spiculated nodules throughout the aerated portions of the left lung and throughout the right lung as well compatible with metastatic disease. 3. Extensive bilateral mediastinal and hilar adenopathy compatible with metastatic disease. 4. Large left pleural effusion with some lobular margins worrisome for malignant effusion with loculation. 5. Abrupt tapering of the pulmonary arteries to the lingula and left upper lobe likely secondary to extrinsic compression from the extensive masslike process in the left upper lobe. 6. Some ground-glass in the residual aerated portions of the left lower lobe concerning for postobstructive pneumonia. 7. Nodular projection in the trachea could reflect adherent secretions versus indeterminate polypoid nodule. 8. Extensive coronary artery calcifications likely with prior coronary stenting. 9.  Aortic Atherosclerosis (ICD10-I70.0). Critical Value/emergent results were called by telephone at the time of interpretation on 10/16/2019 at 5:01 am to provider [facility] , who verbally acknowledged these results. Electronically Signed   By: Lovena Le M.D.   On: 10/16/2019 05:00   DG Chest Port 1 View  Result Date: 10/16/2019 CLINICAL DATA:  Status post left thoracentesis with reported 400 cc removed. EXAM: PORTABLE CHEST 1 VIEW COMPARISON:  10/16/2019 at 12:01 a.m. FINDINGS: There is increased opacification of the left hemithorax compared to the earlier exam despite interval thoracentesis. No pneumothorax. Patchy  airspace opacities noted in the right upper lung, unchanged. Right mid and lower lung is clear. IMPRESSION: 1. No evidence of a pneumothorax following thoracentesis. 2. There has been further opacification of the left hemithorax since the prior chest radiograph. This is presumably due to a combination of residual fluid, atelectasis and the pulmonary nodules noted on the current CT. 3. Stable patchy opacities in the right upper lung are consistent with the nodules/masses noted on the current CT. Electronically Signed   By: Lajean Manes M.D.   On: 10/16/2019 16:36   DG Chest Port 1 View  Result Date: 10/16/2019 CLINICAL DATA:  Epigastric pain EXAM: PORTABLE CHEST 1 VIEW COMPARISON:  Radiograph 05/27/2016 FINDINGS: There is a large left pleural effusion with overall increased attenuation throughout the left hemithorax likely reflecting some layering pleural fluid posteriorly as well. Dense opacities are present in the left lung which likely reflect atelectatic change but with some more focally consolidative perihilar density. Some patchy opacities are present in  the right upper lung as well. Much of the left heart border is obscured by overlying opacity though the overall size of the cardiac silhouette appears increased from comparison portable radiograph. No visible pneumothorax. No acute osseous or soft tissue abnormality. Degenerative changes are present in the imaged spine and shoulders. Telemetry leads overlie the chest. IMPRESSION: Large left pleural effusion with some hazy interstitial opacities throughout the lungs, could reflect edema possibly related to CHF or volume overload. More consolidative opacity in the left mid lung and right apex raise could reflect either developing alveolar edema or infection in the appropriate clinical setting. Electronically Signed   By: Lovena Le M.D.   On: 10/16/2019 00:33   VAS [place] UPPER EXTREMITY VENOUS DUPLEX  Result Date: 10/17/2019 UPPER VENOUS STUDY   Indications: Edema Risk Factors: None identified. Limitations: Body habitus. Comparison Study: No prior studies. Performing Technologist: Oliver Hum RVT  Examination Guidelines: A complete evaluation includes B-mode imaging, spectral Doppler, color Doppler, and power Doppler as needed of all accessible portions of each vessel. Bilateral testing is considered an integral part of a complete examination. Limited examinations for reoccurring indications may be performed as noted.  Right Findings: +----------+------------+---------+-----------+----------+-------+ RIGHT     CompressiblePhasicitySpontaneousPropertiesSummary +----------+------------+---------+-----------+----------+-------+ Subclavian    Full       Yes       Yes                      +----------+------------+---------+-----------+----------+-------+  Left Findings: +----------+------------+---------+-----------+----------+-------+ LEFT      CompressiblePhasicitySpontaneousPropertiesSummary +----------+------------+---------+-----------+----------+-------+ IJV           Full       Yes       Yes                      +----------+------------+---------+-----------+----------+-------+ Subclavian    None       No        No                Acute  +----------+------------+---------+-----------+----------+-------+ Axillary      None       No        No                Acute  +----------+------------+---------+-----------+----------+-------+ Brachial    Partial      No        No                Acute  +----------+------------+---------+-----------+----------+-------+ Radial        Full                                          +----------+------------+---------+-----------+----------+-------+ Ulnar         Full                                          +----------+------------+---------+-----------+----------+-------+ Cephalic      Full                                           +----------+------------+---------+-----------+----------+-------+ Basilic       None  Acute  +----------+------------+---------+-----------+----------+-------+  Summary:  Right: No evidence of thrombosis in the subclavian.  Left: Findings consistent with acute deep vein thrombosis involving the left subclavian vein, left axillary vein and left brachial veins. Findings consistent with acute superficial vein thrombosis involving the left basilic vein.  *See table(s) above for measurements and observations.    Preliminary    US THORACENTESIS ASP PLEURAL SPACE W/IMG GUIDE  Result Date: 10/16/2019 INDICATION: Patient with history of left upper lobe mass, dyspnea, and left pleural effusion. Request made for diagnostic and therapeutic left thoracentesis. EXAM: ULTRASOUND GUIDED DIAGNOSTIC AND THERAPEUTIC THORACENTESIS MEDICATIONS: 8 mL 1% lidocaine COMPLICATIONS: None immediate. PROCEDURE: An ultrasound guided thoracentesis was thoroughly discussed with the patient and questions answered. The benefits, risks, alternatives and complications were also discussed. The patient understands and wishes to proceed with the procedure. Written consent was obtained. Ultrasound was performed to localize and mark an adequate pocket of fluid in the left chest. The area was then prepped and draped in the normal sterile fashion. 1% Lidocaine was used for local anesthesia. Under ultrasound guidance a 6 Fr Safe-T-Centesis catheter was introduced. Thoracentesis was performed. The catheter was removed and a dressing applied. FINDINGS: A total of approximately 400 mL of clear gold fluid was removed. Procedure was stopped after 400 mL due to patient's chest discomfort. Samples were sent to the laboratory as requested by the clinical team. IMPRESSION: Successful ultrasound guided left thoracentesis yielding 400 mL of pleural fluid. Read by: Earley Abide, PA-C Electronically Signed   By: Jacqulynn Cadet M.D.   On: 10/16/2019 16:41        Scheduled Meds: . feeding supplement  1 Container Oral Q24H  . feeding supplement (ENSURE ENLIVE)  237 mL Oral BID BM  . influenza vaccine adjuvanted  0.5 mL Intramuscular Tomorrow-1000  . multivitamin with minerals  1 tablet Oral Daily  . pantoprazole  40 mg Oral Daily  . pneumococcal 23 valent vaccine  0.5 mL Intramuscular Tomorrow-1000   Continuous Infusions: . sodium chloride 75 mL/hr at 10/17/19 1025  . ceFEPime (MAXIPIME) IV 2 g (10/17/19 1034)     LOS: 1 day    Time spent: 30 minutes    Barb Merino, MD [facility] Pager [phone]

## 2019-10-17 NOTE — Consult Note (Signed)
@LOGO @  Sydney Jordan, female    DOB: 06-Mar-1930, 86 y.o.   MRN: [id number]   Brief patient profile:  69 yobf quit smoking around 06/2019 with onset of sob, nonproductive cough and dysphagia all of which progressed with new  hoarseness and L UE swelling sev weeks PTA > admit 1/27 and CT w/c bronchogenic Ca L hilar/assoc effusion > tapped pm 1/28 and PCCM consulted am 1/29 ? Needs fob.    History of Present Illness    Chief Complaint  Patient presents with  . Anorexia  . Hand Problem    left hand swollen  Dyspnea:  Assoc gen weakness to point room to room, only able to dress/ bathe and feed herself at this point = MMRC4  = sob if tries to leave home or while getting dressed   Cough: day > noct, non productive, never hemoptysis   No obvious day to day or daytime variability or assoc excess/ purulent sputum or mucus plugs or hemoptysis or cp or chest tightness, subjective wheeze or overt sinus or hb symptoms.     Also denies any obvious fluctuation of symptoms with weather or environmental changes or other aggravating or alleviating factors except as outlined above   No unusual exposure hx or h/o childhood pna/ asthma or knowledge of premature birth.  Current Allergies, Complete Past Medical History, Past Surgical History, Family History, and Social History were reviewed in [employer] record.  ROS  The following are not active complaints unless bolded Hoarseness, sore throat, dysphagia (food sticks mid chest), dental problems, itching, sneezing,  nasal congestion or discharge of excess mucus or purulent secretions, ear ache,   fever, chills, sweats, unintended wt loss or wt gain, classically pleuritic or exertional cp,  orthopnea pnd or L arm/hand swelling  or leg swelling, presyncope, palpitations, abdominal pain, anorexia, nausea, vomiting, diarrhea  or change in bowel habits or change in bladder habits, change in stools or change in urine, dysuria, hematuria,   rash, arthralgias, visual complaints, headache, numbness, weakness or ataxia or problems with walking or coordination,  change in mood or  memory.             Past Medical History:  Diagnosis Date  . Arthritis   . Cholelithiasis   . GERD (gastroesophageal reflux disease)   . Hypertension   . Multiple gastric ulcers       Scheduled Meds: . feeding supplement  1 Container Oral Q24H  . feeding supplement (ENSURE ENLIVE)  237 mL Oral BID BM  . influenza vaccine adjuvanted  0.5 mL Intramuscular Tomorrow-1000  . multivitamin with minerals  1 tablet Oral Daily  . pantoprazole  40 mg Oral Daily  . pneumococcal 23 valent vaccine  0.5 mL Intramuscular Tomorrow-1000   Continuous Infusions: . sodium chloride 75 mL/hr at 10/17/19 1025  . ceFEPime (MAXIPIME) IV 2 g (10/17/19 1034)  . [START ON 10/18/2019] vancomycin     PRN Meds:.acetaminophen **OR** acetaminophen, hydrALAZINE, ondansetron **OR** ondansetron (ZOFRAN) IV     Objective:     BP 112/64 (BP Location: Right Arm)   Pulse 69   Temp 97.8 F (36.6 C)   Resp 18   Ht 5\' 4"  (1.626 m)   Wt 40.3 kg   SpO2 93%   BMI 15.25 kg/m   SpO2: 93 %  Elderly emaciated and very hoarse bf nad RA   HEENT : edentulous/ moderately hard of hearing    NECK :  without JVD/Nodes/TM/ nl carotid upstrokes bilaterally  LUNGS: no acc muscle use,  Nl contour chest with absent bs on L with dullness   CV:  RRR  no s3 or murmur or increase in P2   ABD:  soft and nontender with nl inspiratory excursion in the supine position. No bruits or organomegaly appreciated, bowel sounds nl  MS:   Ext with reduced musculature and warm without  calf tenderness, cyanosis  ?  Mild  Clubbing and  marked swelling entire  L UE vs R   SKIN: warm and dry without lesions    NEURO:  alert, approp, nl sensorium with  no motor or cerebellar deficits apparent.     I personally reviewed images and agree with radiology impression as follows:  pCXR:   1/28 p  tcentesis: 1. No evidence of a pneumothorax following thoracentesis. 2. There has been further opacification of the left hemithorax since the prior chest radiograph. This is presumably due to a combination of residual fluid, atelectasis and the pulmonary nodules noted on the current CT. 3. Stable patchy opacities in the right upper lung are consistent with the nodules/masses noted on the current CT. 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   Given her age and wt loss she won't likely tolerate RT or Chemo well and clearly not a surgical candidate so goal for now should be to arrive at a tissue dx either by the cytology already obtained or by FOB early next week with EBUS if indicated.   Other option is to send home and do PET first to see if less invasive procedure would do though I favor FOB here as very likely there in endobonchial dz here.   Discussed in detail all the  indications, usual  risks and alternatives  relative to the benefits with patient who agrees to proceed with w/u as outlined.        Christinia Gully, MD 10/17/2019

## 2019-10-17 NOTE — Progress Notes (Signed)
Pathology reported as metastatic non-small cell lung cancer.  Patient is not a treatment candidate. Appreciate palliative care coordination.  Called and updated patient's to grandson on the phone.  Consult hospice for home hospice arrangement. Family wants her to spend time at home, they have enough support at home. Patient has left upper arm DVT and has terminal cancer. Given severe frailty and short life expectancy, will not start on any anticoagulation as it may be associated with more bleeding and complications.

## 2019-10-17 NOTE — Progress Notes (Signed)
Left upper extremity venous duplex has been completed. Preliminary results can be found in CV Proc through chart review.  Results were given to the patient's nurse, Sophia.  10/17/19 10:37 AM Carlos Levering RVT

## 2019-10-17 NOTE — Consult Note (Signed)
Consultation Note Date: 10/17/2019   Patient Name: Sydney Jordan  DOB: 05-27-1930  MRN: [id number]  Age / Sex: 86 y.o., female   PCP: Patient, No Pcp Per Referring Physician: Barb Merino, MD   REASON FOR CONSULTATION:Establishing goals of care  Palliative Care consult requested for goals of care discussion in this 86 y.o. female with multiple medical problems including tobacco abuse, hypertension, peptic ulcer disease, and GERD. Patient presented to ED with complaints of left upper arm swelling. During work-up patient reported shortness of breath x 5 days and weight loss. COVID negative. Chest x-ray showed left-sided pleural effusion. CT chest with contrast shows large left pleural effusion with left upper lobe mass concerning for malignancies with metastatic lesions compression of the pulmonary vessels.  Also shows postobstructive pneumonia. Multiple nodules. She has been seen by Pulmonology with recommendations for tissue diagnosis via pending cytology or by FOB. Patient is most likely not a surgical candidate or candidate for chemoradiation given her age, weight loss, and poor overall performance status.   Clinical Assessment and Goals of Care: I have reviewed medical records including lab results, imaging, Epic notes, and MAR, received report from the bedside RN, and assessed the patient. I met at the bedside with Ms. Shoults and also spoke with her family via conference call (Daughter, Holbrook, and 2 grandsons Dominica Severin and [place]) to discuss diagnosis prognosis, [place], EOL wishes, disposition and options.   Mrs. Heming is awake, alert and oriented x3. She is hoarse and speaks in a soft tone.   I introduced Palliative Medicine as specialized medical care for people living with serious illness. It focuses on providing relief from the symptoms and stress of a serious illness. The goal is to improve quality of life for both the patient and the family. Both patient and family verbalized  understanding and appreciation of Palliative's support.   We discussed a brief life review of the patient, along with her functional and nutritional status. Ms. Radebaugh reports she lives with her grandsons Dominica Severin and [place]). She is a retired from [employer]. 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 She is divorced. She is of [place] faith.   Family reports prior to admission patient had become increasing weak and easily fatigued over the past 2 months. They are not certain on the exact amount but she has experienced unexpected weight loss which they associated with the sudden change in her appetite. Family reports patient would hardly eat and when she did it was only a few bites. Patient reports discomfort with swallowing and also lack of appetite. She was ambulatory in the hall and able to perform most ADLs independently.   We discussed Her current illness and what it means in the larger context of Her on-going co-morbidities. With specific discussions regarding her weight loss, poor nutritional intake, newly found cancer, and her overall functional decline. Natural disease trajectory and expectations at EOL were discussed.  Family verbalized understanding of patient's current illness. Daughter reports patient has been a smoker since the age of 83-17. She currently still smokes. Updates provided to family and detailed discussion regarding aggressive medical interventions and comfort.   Patient deferring all discussions to family. Grandson reports patient would want to be with family regardless of the outcome. Family all verbalizes their understanding of patient's poor overall condition and expressed they would not want to put her through aggressive interventions with no meaningful recovery or outcome. They expressed their wishes to continue with current treatment  options and interest in receiving a definitive diagnosis from the cytology report to have a better understanding of her  condition and prognosis. They verbalized understanding of her most likely not being a candidate for chemotherapy, surgical interventions, or radiation and voiced they are ok with this once confirmed by medical team.   Family expressed once they have definitive answers they plan to prepare for patient's return home as this is what she would want. Daughter express ultimately their goal would be for her to be with family and enjoy what time she has left even if that means home with family and having a cigarette for comfort. Support given.   I attempted to elicit values and goals of care important to the patient.    Advanced directives, concepts specific to code status, artifical feeding and hydration, and rehospitalization were considered and discussed. Patient does not have a documented advanced directive. Family confirms wishes for DNR/DNI. Patient also confirms wishes.   Family requesting to discuss hospice care in the home. Hospice and Palliative Care services outpatient were explained and offered. Patient and family verbalized their understanding and awareness of both palliative and hospice's goals and philosophy of care. Family verbalized understanding and expressed they are leaning towards hospice support in the home but again wished to have further discussions with attending and or other medical team members involved before making any final decisions.   Questions and concerns were addressed.  The family was encouraged to call with questions or concerns.  PMT will continue to support holistically.   SOCIAL HISTORY:     reports that she has been smoking. She has a 32.50 pack-year smoking history. She has never used smokeless tobacco. She reports that she does not drink alcohol or use drugs.  CODE STATUS: DNR  ADVANCE DIRECTIVES:  Per patient Dominica Severin is first and main contact and he will include others as needed.    SYMPTOM MANAGEMENT: Per attending   Palliative Prophylaxis:   Aspiration,  Bowel Regimen, Delirium Protocol, Eye Care, Frequent Pain Assessment, Oral Care and Turn Reposition  PSYCHO-SOCIAL/SPIRITUAL:  Support System: Family  Desire for further Chaplaincy support: No   Additional Recommendations (Limitations, Scope, Preferences):  No Artificial Feeding and Continue current plan of care, DNR/DNI   PAST MEDICAL HISTORY: Past Medical History:  Diagnosis Date  . Arthritis   . Cholelithiasis   . GERD (gastroesophageal reflux disease)   . Hypertension   . Multiple gastric ulcers     PAST SURGICAL HISTORY:  Past Surgical History:  Procedure Laterality Date  . CHOLECYSTECTOMY    . ESOPHAGOGASTRODUODENOSCOPY (EGD) WITH PROPOFOL N/A 11/30/2016   Procedure: ESOPHAGOGASTRODUODENOSCOPY (EGD) WITH PROPOFOL;  Surgeon: Milus Banister, MD;  Location: [facility];  Service: Endoscopy;  Laterality: N/A;  . LAPAROTOMY N/A 08/26/2016   Procedure: EXPLORATORY LAPAROTOMY, PRIMARY CLOSURE OF PERFORATED PYLORIC ULCER WITH OMENTAL PATCH, OPEN CHOLECYSTECTOMY;  Surgeon: Jackolyn Confer, MD;  Location: [facility] ORS;  Service: General;  Laterality: N/A;    ALLERGIES:  has No Known Allergies.   MEDICATIONS:  Current Facility-Administered Medications  Medication Dose Route Frequency Provider Last Rate Last Admin  . 0.9 %  sodium chloride infusion   Intravenous Continuous Barb Merino, MD 75 mL/hr at 10/17/19 1025 New Bag at 10/17/19 1025  . acetaminophen (TYLENOL) tablet 650 mg  650 mg Oral Q6H PRN Rise Patience, MD       Or  . acetaminophen (TYLENOL) suppository 650 mg  650 mg Rectal Q6H PRN Rise Patience, MD      . [  START ON 10/18/2019] ceFEPIme (MAXIPIME) 2 g in sodium chloride 0.9 % 100 mL IVPB  2 g Intravenous Q24H Wofford, Cindie Laroche, RPH      . feeding supplement (BOOST / RESOURCE BREEZE) liquid 1 Container  1 Container Oral Q24H Ghimire, Dante Gang, MD      . feeding supplement (ENSURE ENLIVE) (ENSURE ENLIVE) liquid 237 mL  237 mL Oral BID BM Barb Merino, MD       . hydrALAZINE (APRESOLINE) injection 10 mg  10 mg Intravenous Q4H PRN Rise Patience, MD      . influenza vaccine adjuvanted (FLUAD) injection 0.5 mL  0.5 mL Intramuscular Tomorrow-1000 Rise Patience, MD      . multivitamin with minerals tablet 1 tablet  1 tablet Oral Daily Barb Merino, MD      . ondansetron (ZOFRAN) tablet 4 mg  4 mg Oral Q6H PRN Rise Patience, MD       Or  . ondansetron [facility]) injection 4 mg  4 mg Intravenous Q6H PRN Rise Patience, MD      . pantoprazole (PROTONIX) EC tablet 40 mg  40 mg Oral Daily Rise Patience, MD      . pneumococcal 23 valent vaccine (PNEUMOVAX-23) injection 0.5 mL  0.5 mL Intramuscular Tomorrow-1000 Rise Patience, MD        VITAL SIGNS: BP 127/65 (BP Location: Right Arm)   Pulse 65   Temp 97.6 F (36.4 C) (Oral)   Resp (!) 22   Ht _0  (1.626 m)   Wt 40.3 kg   SpO2 91%   BMI 15.25 kg/m  Filed Weights   10/16/19 0509  Weight: 40.3 kg    Estimated body mass index is 15.25 kg/m as calculated from the following:   Height as of this encounter: _1  (1.626 m).   Weight as of this encounter: 40.3 kg.  LABS: CBC:    Component Value Date/Time   WBC 12.9 (H) 10/16/2019 0004   HGB 12.8 10/16/2019 0004   HCT 40.5 10/16/2019 0004   PLT 195 10/16/2019 0004   Comprehensive Metabolic Panel:    Component Value Date/Time   NA 141 10/16/2019 0004   K 4.0 10/16/2019 0004   CO2 24 10/16/2019 0004   BUN 24 (H) 10/16/2019 0004   CREATININE 0.99 10/16/2019 0004   ALBUMIN 3.4 (L) 10/16/2019 0004     Review of Systems  Constitutional: Positive for activity change, appetite change, fatigue and unexpected weight change.  Respiratory: Positive for shortness of breath.   Neurological: Positive for weakness.  Unless otherwise noted, a complete review of systems is negative.  Physical Exam General: NAD, frail ill appearing, cachectic  Cardiovascular: regular rate and rhythm Pulmonary: diminished   Abdomen: soft, nontender, + bowel sounds Extremities: left arm edema  Skin: no rashes, thin Neurological: A&O x3, mood appropriate, hoarse, soft whisper, will follow commands   Prognosis: Poor in the setting of dysphagia, severe protein calorie malnutrition, cachectic, BMI 15, left upper arm DVT, CT showing bronchogenic cancer, multiple bilateral lung lesions, left-sided pleural effusion, post obstructive pneumonia, dysphonia, deconditioned, generalized weakness, tobacco use. Once family is in agreement with comfort care and no aggressive interventions patient would be appropriate to discharge home with family and hospice support (equipment needs).   Discharge Planning:  To Be Determined  Recommendations:  DNR/DNI-as confirmed by patient/family  Continue with current plan of care per medical team  Family leaning towards home with hospice support. Main goal is allow patient to  spend what time she has left with family. However, would like definitive diagnosis and results of cytology with understanding patient is not candidate for aggressive interventions due to frailty and weight loss. Family is realistic in their expectations and goals. Patient defers all discussions to family states she does not wish to discuss or doesn't understand it all.   PMT will continue to support and follow. I will not be on service this weekend and will report off to my colleague Haynes Dage, [place].   Palliative Performance Scale: PPS 20%                Family expressed understanding and was in agreement with this plan.   Thank you for allowing the Palliative Medicine Team to assist in the care of this patient.  Time In: 1330 Time Out: 1435 Time Total: 65 min.   Visit consisted of counseling and education dealing with the complex and emotionally intense issues of symptom management and palliative care in the setting of serious and potentially life-threatening illness.Greater than 50%  of this time was spent  counseling and coordinating care related to the above assessment and plan.  Signed by:  Alda Lea, AGPCNP-BC Palliative Medicine Team  Phone: [phone] Fax: [phone] Pager: [phone] Amion: Bjorn Pippin

## 2019-10-17 NOTE — Progress Notes (Signed)
Dear Doctor: This patient has been identified as a candidate for PICC for the following reason (s): IV therapy over 48 hours, drug pH or osmolality (causing phlebitis, infiltration in 24 hours) and poor veins/poor circulatory system (CHF, COPD, emphysema, diabetes, steroid use, IV drug abuse, etc.) If you agree, please write an order for the indicated device. For any questions contact the Vascular Access Team at [phone] if no answer, please leave a message.  Thank you for supporting the early vascular access assessment program.

## 2019-10-18 DIAGNOSIS — C3490 Malignant neoplasm of unspecified part of unspecified bronchus or lung: Secondary | ICD-10-CM

## 2019-10-18 DIAGNOSIS — R64 Cachexia: Secondary | ICD-10-CM | POA: Diagnosis present

## 2019-10-18 DIAGNOSIS — Z515 Encounter for palliative care: Secondary | ICD-10-CM

## 2019-10-18 MED ORDER — MORPHINE SULFATE (CONCENTRATE) 10 MG/0.5ML PO SOLN: 5.0000 mg | ORAL | 0 refills | Status: AC | PRN

## 2019-10-18 MED ORDER — MORPHINE SULFATE (CONCENTRATE) 10 MG/0.5ML PO SOLN: 5.0000 mg | ORAL | 0 refills | Status: DC | PRN

## 2019-10-18 MED ORDER — MORPHINE SULFATE (CONCENTRATE) 10 MG/0.5ML PO SOLN
5.0000 mg | ORAL | Status: DC | PRN
  Administered 2019-10-18: 5 mg via SUBLINGUAL
  Filled 2019-10-18: qty 0.5

## 2019-10-18 MED ORDER — LORAZEPAM 0.5 MG PO TABS: 0.5000 mg | ORAL_TABLET | ORAL | 0 refills | Status: AC | PRN

## 2019-10-18 MED ORDER — LORAZEPAM 0.5 MG PO TABS: 0.5000 mg | ORAL_TABLET | ORAL | Status: DC | PRN

## 2019-10-18 MED ORDER — LORAZEPAM 2 MG/ML IJ SOLN: 0.5000 mg | INTRAMUSCULAR | Status: DC | PRN

## 2019-10-18 NOTE — Discharge Summary (Signed)
Physician Discharge Summary  ETHAN KASPERSKI LGX:[id number] DOB: June 16, 1930 DOA: 10/15/2019  PCP: Patient, No Pcp Per  Admit date: 10/15/2019 Discharge date: 10/18/2019  Admitted From: Home Disposition: Home with home hospice  Recommendations for Outpatient Follow-up: As planned by hospice at home Home Health: Home hospice Equipment/Devices: Oxygen, DME bed to be provided by hospice  Discharge Condition: Fair CODE STATUS: Do not Diet recommendation: Pleasure feeding  Discharge summary: 86 year old female with no significant medical issues, history of H. pylori associated ulcer, lifelong smoker who had been having poor appetite and weight loss for about 3 months now, she had gradual difficulty swallowing and difficulty to speak for about 2 to 3 weeks now.  Patient was brought to the hospital because she started having left arm swelling. In the emergency room hemodynamically stable. Patient was found with extensive metastatic lung cancer.  Metastatic non-small cell lung cancer, multiple bilateral lung lesions, left-sided pleural effusion, postobstructive pneumonia left upper lobe postobstructive consolidation.  Dysphagia and dysphonia.   Currently hemodynamically stable.  Thoracentesis 1/28, exudate, no neutrophils.  Biopsy resulted with metastatic non-small cell lung cancer.   Seen by pulmonology, not a treatment candidate.   Given patient's age, stage IIIb or stage IV lung cancer, frailty and dysphagia and cancer cachexia she is less likely to tolerate any chemotherapy or radiation therapy as palliation. Followed by palliative care. Discussed with patient's family, she has very good support at home and they would like to take her home with home hospice. Continue supportive care, comfort feeding, morphine for air hunger, Ativan for anxiety. Prescriptions were sent to the pharmacy.  She will be admitted with home hospice program and further management as per hospice  provider.  Dysphagia/severe protein calorie malnutrition: Cancer related cachexia and also suspect obstruction.  Now a terminal stage.  Symptomatic treatment.    Allow pleasure feeding.  Left upper extremity multiple vein DVT: No PE. Upper extremity DVT.  Patient is with terminal disease, life expectancy few days to weeks, she will not benefit with anticoagulation, adverse events including bleeding could be uncomfortable and catastrophic.  Will not start on anticoagulation.  Patient has good support system at home.  She is able to go home today.  She will be admitted by home hospice program to provide end-of-life care at home.   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/ clinical dx in setting of tumor in AP window   DVT of upper extremity (deep vein thrombosis) ([place])   Cancer cachexia ([place])   Encounter for hospice care    Discharge Instructions  Discharge Instructions    Activity as tolerated - No restrictions   Complete by: As directed    Fall precautions   Diet general   Complete by: As directed    Pleasure diet ,     Allergies as of 10/18/2019   No Known Allergies     Medication List    STOP taking these medications   amLODipine 5 MG tablet Commonly known as: NORVASC   multivitamin with minerals Tabs tablet   pantoprazole 40 MG tablet Commonly known as: PROTONIX     TAKE these medications   feeding supplement (ENSURE ENLIVE) Liqd Take 237 mLs by mouth 2 (two) times daily between meals.   LORazepam 0.5  MG tablet Commonly known as: Ativan Take 1 tablet (0.5 mg total) by mouth every 4 (four) hours as needed for up to 5 days for anxiety.   morphine CONCENTRATE 10 MG/0.5ML Soln concentrated solution Place 0.25 mLs (5 mg total)  under the tongue every 3 (three) hours as needed for up to 5 days for severe pain, anxiety or shortness of breath.       No Known Allergies  Consultations:  PCCM  Palliative medicine   Procedures/Studies: CT CHEST W CONTRAST  Result Date: 10/16/2019 CLINICAL DATA:  Abnormal chest x-ray, large pleural effusion, question lung nodules EXAM: CT CHEST WITH CONTRAST TECHNIQUE: Multidetector CT imaging of the chest was performed during intravenous contrast administration. CONTRAST:  68mL OMNIPAQUE IOHEXOL 300 MG/ML  SOLN COMPARISON:  Same day chest radiograph FINDINGS: Cardiovascular: Rightward mediastinal shift. Normal cardiac size. No pericardial effusion. Extensive coronary artery calcifications are noted, likely with prior coronary stenting. Central pulmonary arteries are normal caliber. No large central filling defects on this non tailored examination. There is abrupt tapering of the pulmonary arteries to the lingula and left upper lobe. Atherosclerotic plaque within the normal caliber aorta. Normal branching of the aortic arch. Mild calcification of the proximal great vessels. Mediastinum/Nodes: Rightward mediastinal shift secondary to a large volume positive process in the left hemithorax. Nodule seen in the mid trachea (2/21), could reflect a tracheal mass versus adherent secretions. Additional web-like secretions are noted more distally. There is abrupt truncation of many of the central airways entering the left lung as well including complete truncation of the left upper lobe and lingular airways and severe narrowing of the left lower lobe airways. Extensive bilateral mediastinal and hilar adenopathy is noted. Instance a 2.2 cm subcarinal nodal mass (2/53), and a 1.2 cm right hilar node (2/52) there is direct involvement of the left hilum from the extensive masslike process extending throughout the left upper lobe which also appears to have indistinct margins with the anterior mediastinal borders  extending into the prevascular space and AP window. Lungs/Pleura: There is complete consolidation of the left upper lobe with internal heterogeneity and irregular lobular margins suggesting mass infiltration extending into or from the left hilum as well extension to the anterior mediastinum as well. There is partial atelectatic collapse of the left lower lung with numerous nodular masslike opacities. Largest measures 2.6 cm in size and directly narrows the hilar vessels and airways (2/68). Some patchy ground-glass and tree-in-bud opacities in residually aerated left lung base may reflect areas of postobstructive infection or inflammation. Large left pleural effusion is noted as well with possible loculation towards the lung apex. There are numerous spiculated nodules throughout the right lung as well occluding the largest in the right apex measuring up to 2.8 cm (2/19). Upper Abdomen: No acute abnormalities present in the visualized portions of the upper abdomen. Musculoskeletal: Multilevel degenerative changes are present in the imaged portions of the spine. No acute osseous abnormality or suspicious osseous lesion. Motion artifact and demineralization of the bones may limit detection of subtle nondisplaced fractures or anomalies. IMPRESSION: 1. Extensive masslike soft tissue density with infiltration extending throughout the left hilum and anterior mediastinum and involving the completely consolidated left upper lobe concerning for primary site of malignancy. 2. Numerous spiculated nodules throughout the aerated portions of the left lung and throughout the right lung as well compatible with metastatic disease. 3. Extensive bilateral mediastinal and hilar adenopathy compatible with metastatic disease. 4. Large left pleural effusion with some lobular margins worrisome for malignant  effusion with loculation. 5. Abrupt tapering of the pulmonary arteries to the lingula and left upper lobe likely secondary to extrinsic  compression from the extensive masslike process in the left upper lobe. 6. Some ground-glass in the residual aerated portions of the left lower lobe concerning for postobstructive pneumonia. 7. Nodular projection in the trachea could reflect adherent secretions versus indeterminate polypoid nodule. 8. Extensive coronary artery calcifications likely with prior coronary stenting. 9.  Aortic Atherosclerosis (ICD10-I70.0). Critical Value/emergent results were called by telephone at the time of interpretation on 10/16/2019 at 5:01 am to provider [facility] , who verbally acknowledged these results. Electronically Signed   By: Lovena Le M.D.   On: 10/16/2019 05:00   DG Chest Port 1 View  Result Date: 10/16/2019 CLINICAL DATA:  Status post left thoracentesis with reported 400 cc removed. EXAM: PORTABLE CHEST 1 VIEW COMPARISON:  10/16/2019 at 12:01 a.m. FINDINGS: There is increased opacification of the left hemithorax compared to the earlier exam despite interval thoracentesis. No pneumothorax. Patchy airspace opacities noted in the right upper lung, unchanged. Right mid and lower lung is clear. IMPRESSION: 1. No evidence of a pneumothorax following thoracentesis. 2. There has been further opacification of the left hemithorax since the prior chest radiograph. This is presumably due to a combination of residual fluid, atelectasis and the pulmonary nodules noted on the current CT. 3. Stable patchy opacities in the right upper lung are consistent with the nodules/masses noted on the current CT. Electronically Signed   By: Lajean Manes M.D.   On: 10/16/2019 16:36   DG Chest Port 1 View  Result Date: 10/16/2019 CLINICAL DATA:  Epigastric pain EXAM: PORTABLE CHEST 1 VIEW COMPARISON:  Radiograph 05/27/2016 FINDINGS: There is a large left pleural effusion with overall increased attenuation throughout the left hemithorax likely reflecting some layering pleural fluid posteriorly as well. Dense opacities are present  in the left lung which likely reflect atelectatic change but with some more focally consolidative perihilar density. Some patchy opacities are present in the right upper lung as well. Much of the left heart border is obscured by overlying opacity though the overall size of the cardiac silhouette appears increased from comparison portable radiograph. No visible pneumothorax. No acute osseous or soft tissue abnormality. Degenerative changes are present in the imaged spine and shoulders. Telemetry leads overlie the chest. IMPRESSION: Large left pleural effusion with some hazy interstitial opacities throughout the lungs, could reflect edema possibly related to CHF or volume overload. More consolidative opacity in the left mid lung and right apex raise could reflect either developing alveolar edema or infection in the appropriate clinical setting. Electronically Signed   By: Lovena Le M.D.   On: 10/16/2019 00:33   VAS [place] UPPER EXTREMITY VENOUS DUPLEX  Result Date: 10/18/2019 UPPER VENOUS STUDY  Indications: Edema Risk Factors: None identified. Limitations: Body habitus. Comparison Study: No prior studies. Performing Technologist: Oliver Hum RVT  Examination Guidelines: A complete evaluation includes B-mode imaging, spectral Doppler, color Doppler, and power Doppler as needed of all accessible portions of each vessel. Bilateral testing is considered an integral part of a complete examination. Limited examinations for reoccurring indications may be performed as noted.  Right Findings: +----------+------------+---------+-----------+----------+-------+ RIGHT     CompressiblePhasicitySpontaneousPropertiesSummary +----------+------------+---------+-----------+----------+-------+ Subclavian    Full       Yes       Yes                      +----------+------------+---------+-----------+----------+-------+  Left Findings: +----------+------------+---------+-----------+----------+-------+  LEFT       CompressiblePhasicitySpontaneousPropertiesSummary +----------+------------+---------+-----------+----------+-------+ IJV           Full       Yes       Yes                      +----------+------------+---------+-----------+----------+-------+ Subclavian    None       No        No                Acute  +----------+------------+---------+-----------+----------+-------+ Axillary      None       No        No                Acute  +----------+------------+---------+-----------+----------+-------+ Brachial    Partial      No        No                Acute  +----------+------------+---------+-----------+----------+-------+ Radial        Full                                          +----------+------------+---------+-----------+----------+-------+ Ulnar         Full                                          +----------+------------+---------+-----------+----------+-------+ Cephalic      Full                                          +----------+------------+---------+-----------+----------+-------+ Basilic       None                                   Acute  +----------+------------+---------+-----------+----------+-------+  Summary:  Right: No evidence of thrombosis in the subclavian.  Left: Findings consistent with acute deep vein thrombosis involving the left subclavian vein, left axillary vein and left brachial veins. Findings consistent with acute superficial vein thrombosis involving the left basilic vein.  *See table(s) above for measurements and observations.  Diagnosing physician: Servando Snare MD Electronically signed by Servando Snare MD on 10/18/2019 at 8:42:27 AM.    Final    US THORACENTESIS ASP PLEURAL SPACE W/IMG GUIDE  Result Date: 10/16/2019 INDICATION: Patient with history of left upper lobe mass, dyspnea, and left pleural effusion. Request made for diagnostic and therapeutic left thoracentesis. EXAM: ULTRASOUND GUIDED DIAGNOSTIC AND THERAPEUTIC  THORACENTESIS MEDICATIONS: 8 mL 1% lidocaine COMPLICATIONS: None immediate. PROCEDURE: An ultrasound guided thoracentesis was thoroughly discussed with the patient and questions answered. The benefits, risks, alternatives and complications were also discussed. The patient understands and wishes to proceed with the procedure. Written consent was obtained. Ultrasound was performed to localize and mark an adequate pocket of fluid in the left chest. The area was then prepped and draped in the normal sterile fashion. 1% Lidocaine was used for local anesthesia. Under ultrasound guidance a 6 Fr Safe-T-Centesis catheter was introduced. Thoracentesis was performed. The catheter was removed and a dressing applied. FINDINGS: A total of approximately 400 mL of clear gold fluid was removed.  Procedure was stopped after 400 mL due to patient's chest discomfort. Samples were sent to the laboratory as requested by the clinical team. IMPRESSION: Successful ultrasound guided left thoracentesis yielding 400 mL of pleural fluid. Read by: Earley Abide, PA-C Electronically Signed   By: Jacqulynn Cadet M.D.   On: 10/16/2019 16:41       Subjective: Patient seen and examined.  Discussed with different providers.  Patient herself had some difficulty breathing on laying flat.  She states she would like to go home.  Not sure she understands everything that is going on with her.    Discharge Exam: Vitals:   10/18/19 0509 10/18/19 1307  BP: 134/85 (!) 99/50  Pulse: 76 73  Resp: 20 18  Temp:  (!) 97.5 F (36.4 C)  SpO2: 91% 97%   Vitals:   10/17/19 1311 10/17/19 2009 10/18/19 0509 10/18/19 1307  BP: 127/65 (!) 94/57 134/85 (!) 99/50  Pulse: 65 79 76 73  Resp: (!) 22 18 20 18   Temp: 97.6 F (36.4 C) 98 F (36.7 C)  (!) 97.5 F (36.4 C)  TempSrc: Oral     SpO2: 91% 91% 91% 97%  Weight:      Height:        General: Pt is alert, awake, not in acute distress, comfortable on 2 L oxygen.  Cachectic, frail and  debilitated. Cardiovascular: RRR, S1/S2 +, no rubs, no gallops Respiratory: No air entry on the left side. Abdominal: Soft, NT, ND, bowel sounds + Extremities: Edematous left upper extremity.  Nontender.  No cyanosis     The results of significant diagnostics from this hospitalization (including imaging, microbiology, ancillary and laboratory) are listed below for reference.     Microbiology: Recent Results (from the past 240 hour(s))  SARS CORONAVIRUS 2 (TAT 6-24 HRS) Nasopharyngeal Nasopharyngeal Swab     Status: None   Collection Time: 10/16/19  1:47 AM   Specimen: Nasopharyngeal Swab  Result Value Ref Range Status   SARS Coronavirus 2 NEGATIVE NEGATIVE Final    Comment: (NOTE) SARS-CoV-2 target nucleic acids are NOT DETECTED. The SARS-CoV-2 RNA is generally detectable in upper and lower respiratory specimens during the acute phase of infection. Negative results do not preclude SARS-CoV-2 infection, do not rule out co-infections with other pathogens, and should not be used as the sole basis for treatment or other patient management decisions. Negative results must be combined with clinical observations, patient history, and epidemiological information. The expected result is Negative. Fact Sheet for Patients: SugarRoll.be Fact Sheet for Healthcare Providers: https://www.woods-mathews.com/ This test is not yet approved or cleared by the [place] FDA and  has been authorized for detection and/or diagnosis of SARS-CoV-2 by FDA under an Emergency Use Authorization (EUA). This EUA will remain  in effect (meaning this test can be used) for the duration of the COVID-19 declaration under Section 56 4(b)(1) of the Act, 21 U.S.C. section 360bbb-3(b)(1), unless the authorization is terminated or revoked sooner. Performed at [facility], [place] [address], [place], [place] [address]   MRSA PCR Screening     Status: None   Collection  Time: 10/16/19  1:55 PM   Specimen: Nasopharyngeal  Result Value Ref Range Status   MRSA by PCR NEGATIVE NEGATIVE Final    Comment:        The GeneXpert MRSA Assay (FDA approved for NASAL specimens only), is one component of a comprehensive MRSA colonization surveillance program. It is not intended to diagnose MRSA infection nor to guide  or monitor treatment for MRSA infections. Performed at [facility], [place] [address], [place], [place] [address]   Body fluid culture     Status: None (Preliminary result)   Collection Time: 10/16/19  3:45 PM   Specimen: PATH Cytology Pleural fluid  Result Value Ref Range Status   Specimen Description   Final    FLUID LEFT PLEURAL Performed at [facility], [place] [address] [place], [place] [address]    Special Requests   Final    NONE Performed at [facility], [place] [address] [place], [place] [address]    Gram Stain   Final    RARE WBC PRESENT,BOTH PMN AND MONONUCLEAR NO ORGANISMS SEEN    Culture   Final    NO GROWTH 2 DAYS Performed at [facility], [place] [address] [place], [place] [address]    Report Status PENDING  Incomplete  Acid Fast Smear (AFB)     Status: None   Collection Time: 10/16/19  3:45 PM   Specimen: PATH Cytology Pleural fluid  Result Value Ref Range Status   AFB Specimen Processing Concentration  Final   Acid Fast Smear Negative  Final    Comment: (NOTE) Performed At: [facility] [place], [place] [id number] Rush Farmer MD [id number]    Source (AFB) PLEURAL  Final    Comment: LEFT Performed at [facility], [place] [address] [place], [place] [address]      Labs: BNP (last 3 results) Recent Labs    10/16/19 0004  BNP 52.7   Basic Metabolic Panel: Recent Labs  Lab 10/16/19 0004  NA 141  K 4.0  CL 97*  CO2 24  GLUCOSE 105*  BUN 24*  CREATININE 0.99  CALCIUM 9.7   Liver Function Tests: Recent Labs   Lab 10/16/19 0004  AST 31  ALT 15  ALKPHOS 61  BILITOT 1.3*  PROT 7.9  ALBUMIN 3.4*   Recent Labs  Lab 10/16/19 0004  LIPASE 22   No results for input(s): AMMONIA in the last 168 hours. CBC: Recent Labs  Lab 10/16/19 0004  WBC 12.9*  NEUTROABS 11.2*  HGB 12.8  HCT 40.5  MCV 73.5*  PLT 195   Cardiac Enzymes: No results for input(s): CKTOTAL, CKMB, CKMBINDEX, TROPONINI in the last 168 hours. BNP: Invalid input(s): POCBNP CBG: No results for input(s): GLUCAP in the last 168 hours. D-Dimer No results for input(s): DDIMER in the last 72 hours. Hgb A1c No results for input(s): HGBA1C in the last 72 hours. Lipid Profile No results for input(s): CHOL, HDL, LDLCALC, TRIG, CHOLHDL, LDLDIRECT in the last 72 hours. Thyroid function studies No results for input(s): TSH, T4TOTAL, T3FREE, THYROIDAB in the last 72 hours.  Invalid input(s): FREET3 Anemia work up No results for input(s): VITAMINB12, FOLATE, FERRITIN, TIBC, IRON, RETICCTPCT in the last 72 hours. Urinalysis    Component Value Date/Time   COLORURINE AMBER (A) 12/20/2013 1311   APPEARANCEUR CLEAR 12/20/2013 1311   LABSPEC 1.027 12/20/2013 1311   PHURINE 6.5 12/20/2013 1311   GLUCOSEU NEGATIVE 12/20/2013 1311   HGBUR NEGATIVE 12/20/2013 1311   BILIRUBINUR SMALL (A) 12/20/2013 1311   KETONESUR 15 (A) 12/20/2013 1311   PROTEINUR 30 (A) 12/20/2013 1311   UROBILINOGEN 1.0 12/20/2013 1311   NITRITE NEGATIVE 12/20/2013 1311   LEUKOCYTESUR NEGATIVE 12/20/2013 1311   Sepsis Labs Invalid input(s): PROCALCITONIN,  WBC,  LACTICIDVEN Microbiology Recent Results (from the past 240 hour(s))  SARS  CORONAVIRUS 2 (TAT 6-24 HRS) Nasopharyngeal Nasopharyngeal Swab     Status: None   Collection Time: 10/16/19  1:47 AM   Specimen: Nasopharyngeal Swab  Result Value Ref Range Status   SARS Coronavirus 2 NEGATIVE NEGATIVE Final    Comment: (NOTE) SARS-CoV-2 target nucleic acids are NOT DETECTED. The SARS-CoV-2 RNA is  generally detectable in upper and lower respiratory specimens during the acute phase of infection. Negative results do not preclude SARS-CoV-2 infection, do not rule out co-infections with other pathogens, and should not be used as the sole basis for treatment or other patient management decisions. Negative results must be combined with clinical observations, patient history, and epidemiological information. The expected result is Negative. Fact Sheet for Patients: SugarRoll.be Fact Sheet for Healthcare Providers: https://www.woods-mathews.com/ This test is not yet approved or cleared by the [place] FDA and  has been authorized for detection and/or diagnosis of SARS-CoV-2 by FDA under an Emergency Use Authorization (EUA). This EUA will remain  in effect (meaning this test can be used) for the duration of the COVID-19 declaration under Section 56 4(b)(1) of the Act, 21 U.S.C. section 360bbb-3(b)(1), unless the authorization is terminated or revoked sooner. Performed at [facility], [place] [address], [place], [place] [address]   MRSA PCR Screening     Status: None   Collection Time: 10/16/19  1:55 PM   Specimen: Nasopharyngeal  Result Value Ref Range Status   MRSA by PCR NEGATIVE NEGATIVE Final    Comment:        The GeneXpert MRSA Assay (FDA approved for NASAL specimens only), is one component of a comprehensive MRSA colonization surveillance program. It is not intended to diagnose MRSA infection nor to guide or monitor treatment for MRSA infections. Performed at [facility], [place] [address], [place], [place] [address]   Body fluid culture     Status: None (Preliminary result)   Collection Time: 10/16/19  3:45 PM   Specimen: PATH Cytology Pleural fluid  Result Value Ref Range Status   Specimen Description   Final    FLUID LEFT PLEURAL Performed at [facility], [place] [address] [place], [place]  [address]    Special Requests   Final    NONE Performed at [facility], [place] [address], [place], [place] [address]    Gram Stain   Final    RARE WBC PRESENT,BOTH PMN AND MONONUCLEAR NO ORGANISMS SEEN    Culture   Final    NO GROWTH 2 DAYS Performed at [facility], [place] [address] [place], [place] [address]    Report Status PENDING  Incomplete  Acid Fast Smear (AFB)     Status: None   Collection Time: 10/16/19  3:45 PM   Specimen: PATH Cytology Pleural fluid  Result Value Ref Range Status   AFB Specimen Processing Concentration  Final   Acid Fast Smear Negative  Final    Comment: (NOTE) Performed At: [facility] [place], [place] [id number] Rush Farmer MD [id number]    Source (AFB) PLEURAL  Final    Comment: LEFT Performed at [facility], [place] [address], [place], [place] [address]      Time coordinating discharge:  35 minutes  SIGNED:   Barb Merino, MD  [facility] 10/18/2019, 3:34 PM

## 2019-10-18 NOTE — Progress Notes (Signed)
PROGRESS NOTE    Sydney Jordan  OEV:[id number] DOB: [birth year] DOA: 10/15/2019 PCP: Patient, No Pcp Per    Brief Narrative:  86 year old female with no significant medical issues, history of H. pylori associated ulcer, lifelong smoker who had been having poor appetite and weight loss for about 3 months now, she had gradual difficulty swallowing and difficulty to speak for about 2 to 3 weeks now.  Patient was brought to the hospital because she started having left arm swelling. In the emergency room hemodynamically stable. Patient was found with extensive metastatic lung cancer.  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  Dysphagia and dysphonia.   Currently hemodynamically stable. On room air. Thoracentesis 1/28, exudate, no neutrophils.  Biopsy resulted with metastatic non-small cell lung cancer.   Discontinued antibiotics.   Seen by pulmonology, not a treatment candidate.   Given patient's age, stage IIIb or stage IV lung cancer, frailty and dysphagia and cancer cachexia she is less likely to tolerate any chemotherapy or radiation therapy as palliation. Followed by palliative care. Discussed with patient's family, she has very good support at home and they would like to take her home with home hospice. Home hospice arrangements pending today. Continue supportive care, comfort feeding, morphine for air hunger, Ativan for anxiety.  Dysphagia/severe protein calorie malnutrition: Cancer related cachexia and also suspect obstruction.  Now a terminal stage.  Symptomatic treatment.   Will allow comfort diet.  Left upper extremity multiple vein DVT: No PE. Upper extremity DVT.  Patient is with terminal disease, life expectancy few days to weeks, she will not benefit with anticoagulation, adverse events including bleeding could be uncomfortable and catastrophic.  Will not start on anticoagulation.  DVT prophylaxis: SCDs Code Status: DNR Family Communication: Patient's multiple grandchildren on the phone conference 1/30.  Waiting for arrangements at home. Disposition Plan: patient is from home. Anticipated DC to home with hospice. Barriers to discharge, home hospice arrangements to be made.    Consultants:   Pulmonary  Palliative  Procedures:   Thoracentesis, 1/28, 400 mL clear fluid no growth  Antimicrobials:  Anti-infectives (From admission, onward)   Start     Dose/Rate Route Frequency Ordered Stop   10/18/19 1000  vancomycin (VANCOREADY) IVPB 750 mg/150 mL  Status:  Discontinued     750 mg 150 mL/hr over 60 Minutes Intravenous Every 48 hours 10/16/19 0735 10/17/19 1124   10/18/19 1000  ceFEPIme (MAXIPIME) 2 g in sodium chloride 0.9 % 100 mL IVPB  Status:  Discontinued     2 g 200 mL/hr over 30 Minutes Intravenous Every 24 hours 10/17/19 1151 10/18/19 0842   10/16/19 0830  vancomycin (VANCOCIN) IVPB 1000 mg/200 mL premix     1,000 mg 200 mL/hr over 60 Minutes Intravenous  Once 10/16/19 0710 10/16/19 1128   10/16/19 0800  piperacillin-tazobactam (ZOSYN) IVPB 3.375 g  Status:  Discontinued     3.375 g 12.5 mL/hr over 240 Minutes Intravenous Every 8 hours 10/16/19 0710 10/16/19 0729   10/16/19 0800  ceFEPIme (MAXIPIME) 2 g in sodium chloride 0.9 % 100 mL IVPB  Status:  Discontinued  2 g 200 mL/hr over 30 Minutes Intravenous Every 12 hours 10/16/19 0729 10/17/19 1151         Subjective: Patient was seen and examined.  No overnight events.  She was uncomfortable laying flat in the bed, felt somewhat better on repositioning.  Does complain of shortness  of breath when laid flat in the bed.  Some pain on her left arm.  Denies any other complaints.  Cannot eat anything. Objective: Vitals:   10/17/19 1311 10/17/19 2009 10/18/19 0509 10/18/19 1307  BP: 127/65 (!) 94/57 134/85 (!) 99/50  Pulse: 65 79 76 73  Resp: (!) 22 18 20 18   Temp: 97.6 F (36.4 C) 98 F (36.7 C)  (!) 97.5 F (36.4 C)  TempSrc: Oral     SpO2: 91% 91% 91% 97%  Weight:      Height:        Intake/Output Summary (Last 24 hours) at 10/18/2019 1458 Last data filed at 10/18/2019 0500 Gross per 24 hour  Intake 1348.72 ml  Output --  Net 1348.72 ml   Filed Weights   10/16/19 0509  Weight: 40.3 kg    Examination:  General exam: Appears anxious, cachectic, on room air. Respiratory system: Respiratory effort normal. No air entry on the left side. Cardiovascular system: S1 & S2 heard, RRR. No JVD, murmurs, rubs, gallops or clicks. No pedal edema. Gastrointestinal system: Abdomen is nondistended, soft and nontender. No organomegaly or masses felt. Normal bowel sounds heard. Central nervous system: Alert and oriented. No focal neurological deficits. Extremities: Symmetric 5 x 5 power. Skin: No rashes, lesions or ulcers Psychiatry: Judgement and insight appear normal. Mood & affect anxious.  Left arm is swollen, nontender, swollen throughout.    Data Reviewed: I have personally reviewed following labs and imaging studies  CBC: Recent Labs  Lab 10/16/19 0004  WBC 12.9*  NEUTROABS 11.2*  HGB 12.8  HCT 40.5  MCV 73.5*  PLT 563   Basic Metabolic Panel: Recent Labs  Lab 10/16/19 0004  NA 141  K 4.0  CL 97*  CO2 24  GLUCOSE 105*  BUN 24*  CREATININE 0.99  CALCIUM 9.7   GFR: Estimated Creatinine Clearance: 25.5 mL/min (by C-G formula based on SCr of 0.99 mg/dL). Liver Function Tests: Recent Labs  Lab 10/16/19 0004  AST 31  ALT 15  ALKPHOS 61  BILITOT 1.3*  PROT 7.9  ALBUMIN 3.4*   Recent Labs  Lab 10/16/19 0004  LIPASE 22   No results  for input(s): AMMONIA in the last 168 hours. Coagulation Profile: Recent Labs  Lab 10/17/19 0339  INR 1.1   Cardiac Enzymes: No results for input(s): CKTOTAL, CKMB, CKMBINDEX, TROPONINI in the last 168 hours. BNP (last 3 results) No results for input(s): PROBNP in the last 8760 hours. HbA1C: No results for input(s): HGBA1C in the last 72 hours. CBG: No results for input(s): GLUCAP in the last 168 hours. Lipid Profile: No results for input(s): CHOL, HDL, LDLCALC, TRIG, CHOLHDL, LDLDIRECT in the last 72 hours. Thyroid Function Tests: No results for input(s): TSH, T4TOTAL, FREET4, T3FREE, THYROIDAB in the last 72 hours. Anemia Panel: No results for input(s): VITAMINB12, FOLATE, FERRITIN, TIBC, IRON, RETICCTPCT in the last 72 hours. Sepsis Labs: No results for input(s): PROCALCITON, LATICACIDVEN in the last 168 hours.  Recent Results (from the past 240 hour(s))  SARS CORONAVIRUS 2 (TAT 6-24 HRS) Nasopharyngeal Nasopharyngeal Swab     Status: None   Collection Time: 10/16/19  1:47 AM   Specimen: Nasopharyngeal  Swab  Result Value Ref Range Status   SARS Coronavirus 2 NEGATIVE NEGATIVE Final    Comment: (NOTE) SARS-CoV-2 target nucleic acids are NOT DETECTED. The SARS-CoV-2 RNA is generally detectable in upper and lower respiratory specimens during the acute phase of infection. Negative results do not preclude SARS-CoV-2 infection, do not rule out co-infections with other pathogens, and should not be used as the sole basis for treatment or other patient management decisions. Negative results must be combined with clinical observations, patient history, and epidemiological information. The expected result is Negative. Fact Sheet for Patients: SugarRoll.be Fact Sheet for Healthcare Providers: https://www.woods-mathews.com/ This test is not yet approved or cleared by the [place] FDA and  has been authorized for detection and/or  diagnosis of SARS-CoV-2 by FDA under an Emergency Use Authorization (EUA). This EUA will remain  in effect (meaning this test can be used) for the duration of the COVID-19 declaration under Section 56 4(b)(1) of the Act, 21 U.S.C. section 360bbb-3(b)(1), unless the authorization is terminated or revoked sooner. Performed at [facility], [place] [address] [place], [place] [address]   MRSA PCR Screening     Status: None   Collection Time: 10/16/19  1:55 PM   Specimen: Nasopharyngeal  Result Value Ref Range Status   MRSA by PCR NEGATIVE NEGATIVE Final    Comment:        The GeneXpert MRSA Assay (FDA approved for NASAL specimens only), is one component of a comprehensive MRSA colonization surveillance program. It is not intended to diagnose MRSA infection nor to guide or monitor treatment for MRSA infections. Performed at [facility], [place] [address], [place], [place] [address]   Body fluid culture     Status: None (Preliminary result)   Collection Time: 10/16/19  3:45 PM   Specimen: PATH Cytology Pleural fluid  Result Value Ref Range Status   Specimen Description   Final    FLUID LEFT PLEURAL Performed at [facility], [place] [address], [place], [place] [address]    Special Requests   Final    NONE Performed at [facility], [place] [address], [place], [place] [address]    Gram Stain   Final    RARE WBC PRESENT,BOTH PMN AND MONONUCLEAR NO ORGANISMS SEEN    Culture   Final    NO GROWTH 2 DAYS Performed at [facility], [place] [address], [place], [place] [address]    Report Status PENDING  Incomplete  Acid Fast Smear (AFB)     Status: None   Collection Time: 10/16/19  3:45 PM   Specimen: PATH Cytology Pleural fluid  Result Value Ref Range Status   AFB Specimen Processing Concentration  Final   Acid Fast Smear Negative  Final    Comment: (NOTE) Performed At: [facility] [place], [place]  [id number] Rush Farmer MD IW:[id number]    Source (AFB) PLEURAL  Final    Comment: LEFT Performed at [facility], [place] [address] [place],  [address]          Radiology Studies: [facility] Chest Port 1 View  Result Date: 10/16/2019 CLINICAL DATA:  Status post left thoracentesis with reported 400 cc removed. EXAM: PORTABLE CHEST 1 VIEW COMPARISON:  10/16/2019 at 12:01 a.m. FINDINGS: There is increased opacification of the left hemithorax compared to the earlier exam despite interval thoracentesis. No pneumothorax. Patchy airspace opacities noted in the right upper lung, unchanged. Right mid and lower lung  is clear. IMPRESSION: 1. No evidence of a pneumothorax following thoracentesis. 2. There has been further opacification of the left hemithorax since the prior chest radiograph. This is presumably due to a combination of residual fluid, atelectasis and the pulmonary nodules noted on the current CT. 3. Stable patchy opacities in the right upper lung are consistent with the nodules/masses noted on the current CT. Electronically Signed   By: Lajean Manes M.D.   On: 10/16/2019 16:36   VAS [place] UPPER EXTREMITY VENOUS DUPLEX  Result Date: 10/18/2019 UPPER VENOUS STUDY  Indications: Edema Risk Factors: None identified. Limitations: Body habitus. Comparison Study: No prior studies. Performing Technologist: Oliver Hum RVT  Examination Guidelines: A complete evaluation includes B-mode imaging, spectral Doppler, color Doppler, and power Doppler as needed of all accessible portions of each vessel. Bilateral testing is considered an integral part of a complete examination. Limited examinations for reoccurring indications may be performed as noted.  Right Findings: +----------+------------+---------+-----------+----------+-------+ RIGHT     CompressiblePhasicitySpontaneousPropertiesSummary +----------+------------+---------+-----------+----------+-------+ Subclavian    Full        Yes       Yes                      +----------+------------+---------+-----------+----------+-------+  Left Findings: +----------+------------+---------+-----------+----------+-------+ LEFT      CompressiblePhasicitySpontaneousPropertiesSummary +----------+------------+---------+-----------+----------+-------+ IJV           Full       Yes       Yes                      +----------+------------+---------+-----------+----------+-------+ Subclavian    None       No        No                Acute  +----------+------------+---------+-----------+----------+-------+ Axillary      None       No        No                Acute  +----------+------------+---------+-----------+----------+-------+ Brachial    Partial      No        No                Acute  +----------+------------+---------+-----------+----------+-------+ Radial        Full                                          +----------+------------+---------+-----------+----------+-------+ Ulnar         Full                                          +----------+------------+---------+-----------+----------+-------+ Cephalic      Full                                          +----------+------------+---------+-----------+----------+-------+ Basilic       None                                   Acute  +----------+------------+---------+-----------+----------+-------+  Summary:  Right: No evidence of thrombosis in the subclavian.  Left: Findings consistent  with acute deep vein thrombosis involving the left subclavian vein, left axillary vein and left brachial veins. Findings consistent with acute superficial vein thrombosis involving the left basilic vein.  *See table(s) above for measurements and observations.  Diagnosing physician: Servando Snare MD Electronically signed by Servando Snare MD on 10/18/2019 at 8:42:27 AM.    Final    US THORACENTESIS ASP PLEURAL SPACE W/IMG GUIDE  Result Date:  10/16/2019 INDICATION: Patient with history of left upper lobe mass, dyspnea, and left pleural effusion. Request made for diagnostic and therapeutic left thoracentesis. EXAM: ULTRASOUND GUIDED DIAGNOSTIC AND THERAPEUTIC THORACENTESIS MEDICATIONS: 8 mL 1% lidocaine COMPLICATIONS: None immediate. PROCEDURE: An ultrasound guided thoracentesis was thoroughly discussed with the patient and questions answered. The benefits, risks, alternatives and complications were also discussed. The patient understands and wishes to proceed with the procedure. Written consent was obtained. Ultrasound was performed to localize and mark an adequate pocket of fluid in the left chest. The area was then prepped and draped in the normal sterile fashion. 1% Lidocaine was used for local anesthesia. Under ultrasound guidance a 6 Fr Safe-T-Centesis catheter was introduced. Thoracentesis was performed. The catheter was removed and a dressing applied. FINDINGS: A total of approximately 400 mL of clear gold fluid was removed. Procedure was stopped after 400 mL due to patient's chest discomfort. Samples were sent to the laboratory as requested by the clinical team. IMPRESSION: Successful ultrasound guided left thoracentesis yielding 400 mL of pleural fluid. Read by: Earley Abide, PA-C Electronically Signed   By: Jacqulynn Cadet M.D.   On: 10/16/2019 16:41        Scheduled Meds: . feeding supplement  1 Container Oral Q24H  . feeding supplement (ENSURE ENLIVE)  237 mL Oral BID BM  . influenza vaccine adjuvanted  0.5 mL Intramuscular Tomorrow-1000  . multivitamin with minerals  1 tablet Oral Daily  . pantoprazole  40 mg Oral Daily  . pneumococcal 23 valent vaccine  0.5 mL Intramuscular Tomorrow-1000   Continuous Infusions:    LOS: 2 days    Time spent: 30 minutes    Barb Merino, MD [facility] Pager [phone]

## 2019-10-18 NOTE — TOC Progression Note (Addendum)
Transition of Care [facility]) - Progression Note    Patient Details  Name: Sydney Jordan MRN: [id number] Date of Birth: [birth year]  Transition of [facility]) CM/SW Contact  Purcell Mouton, RN Phone Number: 10/18/2019, 3:05 PM  Clinical Narrative:     Spoke to pt's grandson Sydney Jordan and [place] concerning pt going home with Hospice/[facility]. Referral was given to [place], [place] with [facility]. Pt is aware of DME accept home O2 may not be delivered before discharge.        Expected Discharge Plan and Services                                                 Social Determinants of Health (SDOH) Interventions    Readmission Risk Interventions No flowsheet data found.

## 2019-10-18 NOTE — Progress Notes (Signed)
Manufacturing engineer  Received request from [place] for family interest in hospice services at home after discharge. Chart reviewed and eligibility confirmed by hospice physician.   Spoke with patient's grandson Russ Halo by phone to confirm interest, explain services and discuss DME. Family declining offer for hospital bed at this time. They confirm desire for oxygen setup to be delivered to the home at the address on facesheet. Russ Halo is contact for DME coordination.   Please send signed and completed DNR home with patient.   Please make arrangements for patient to have comfort medications.   [facility] referral center specialist will contact Russ Halo to arrange first visit in home after discharge.   Please do not hesitate to call with hospice related questions.   Thank you,  Erling Conte, LCSW [phone]  [facility] liaisons are listed daily on AMION under Hospice and [place]

## 2019-10-18 NOTE — TOC Progression Note (Signed)
Transition of Care [facility]) - Progression Note    Patient Details  Name: Sydney Jordan MRN: [id number] Date of Birth: 08-26-1930  Transition of Care [facility]) CM/SW Contact  Purcell Mouton, RN Phone Number: 10/18/2019, 5:16 PM  Clinical Narrative:    [facility] did not have Morph[facility]. MD had to send Morphine script to [place]  [address]. DNR signed by MD.         Expected Discharge Plan and Services           Expected Discharge Date: 10/18/19                                     Social Determinants of Health (SDOH) Interventions    Readmission Risk Interventions No flowsheet data found.

## 2019-10-18 NOTE — Progress Notes (Signed)
Chart reviewed.  Lt pleural fluid consistent with non small cell carcinoma.  Seen by palliative care.  DNR.  Being assessed for hospice.  PCCM will sign off.  Please call if additional help needed while she is in hospital.  Chesley Mires, MD [place] 10/18/2019, 11:25 AM

## 2019-10-18 NOTE — Evaluation (Signed)
SLP Cancellation Note  Patient Details Name: Sydney Jordan MRN: [id number] DOB: [birth year]   Cancelled treatment:       Reason Eval/Treat Not Completed: Other (comment)(pt now hospice per notes, SLP to sign off, please reconsult if needed)   Kathleen Lime, MS [place] Office [phone]  Macario Golds 10/18/2019, 1:54 PM

## 2019-10-20 LAB — BODY FLUID CULTURE: Culture: NO GROWTH

## 2019-10-20 LAB — VITAMIN B1: Vitamin B1 (Thiamine): 380.2 nmol/L — ABNORMAL HIGH (ref 66.5–200.0)

## 2019-10-20 LAB — CYTOLOGY - NON PAP

## 2019-10-24 ENCOUNTER — Ambulatory Visit: Payer: Medicare Other | Admitting: Gastroenterology

## 2019-11-17 DEATH — deceased

## 2019-12-17 LAB — MAC SUSCEPTIBILITY BROTH
Clarithromycin: 2
Linezolid: 32
Moxifloxacin: 4
Streptomycin: 64

## 2019-12-17 LAB — ACID FAST CULTURE WITH REFLEXED SENSITIVITIES (MYCOBACTERIA): Acid Fast Culture: POSITIVE — AB

## 2019-12-17 LAB — AFB ORGANISM ID BY DNA PROBE
M avium complex: POSITIVE — AB
M kansasii: NEGATIVE
M tuberculosis complex: NEGATIVE

## 2021-09-03 IMAGING — US US THORACENTESIS ASP PLEURAL SPACE W/IMG GUIDE
1 series · 5 of 5 positions shown · non-contrast
Comparison: none

INDICATION: Patient with history of left upper lobe mass, dyspnea, and left
pleural effusion. Request made for diagnostic and therapeutic left
thoracentesis.

[Series 1: us thoracentesis asp pleural space w/img guide · 5 of 5 slices shown]
[im 1/5]
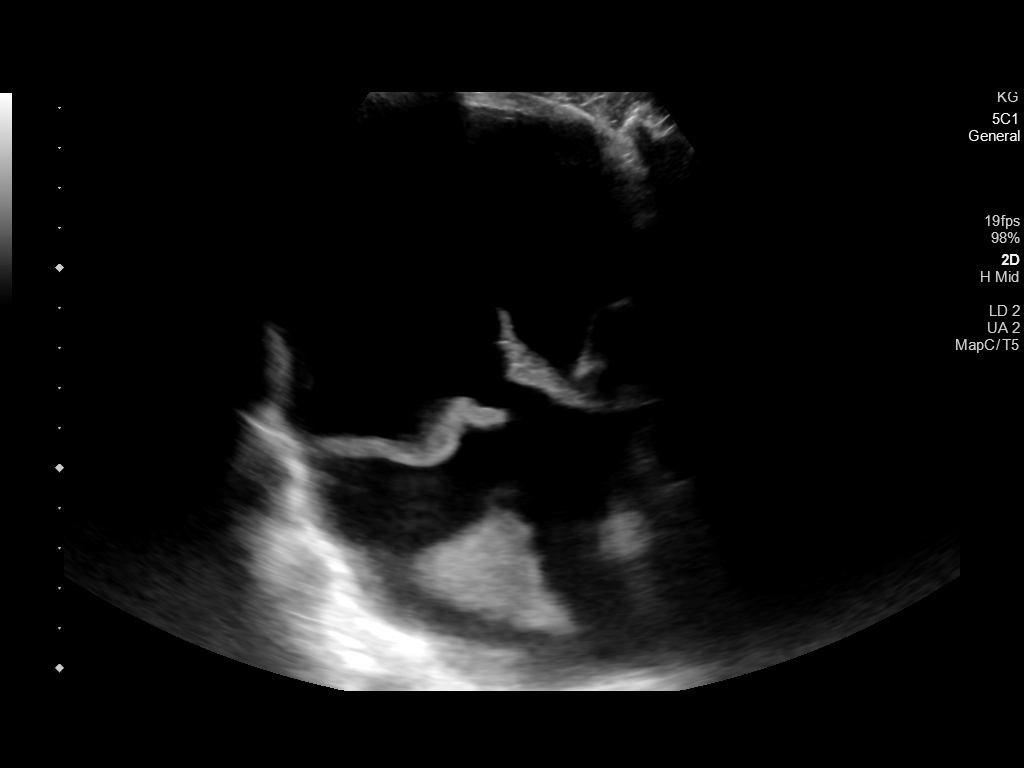
[im 2/5]
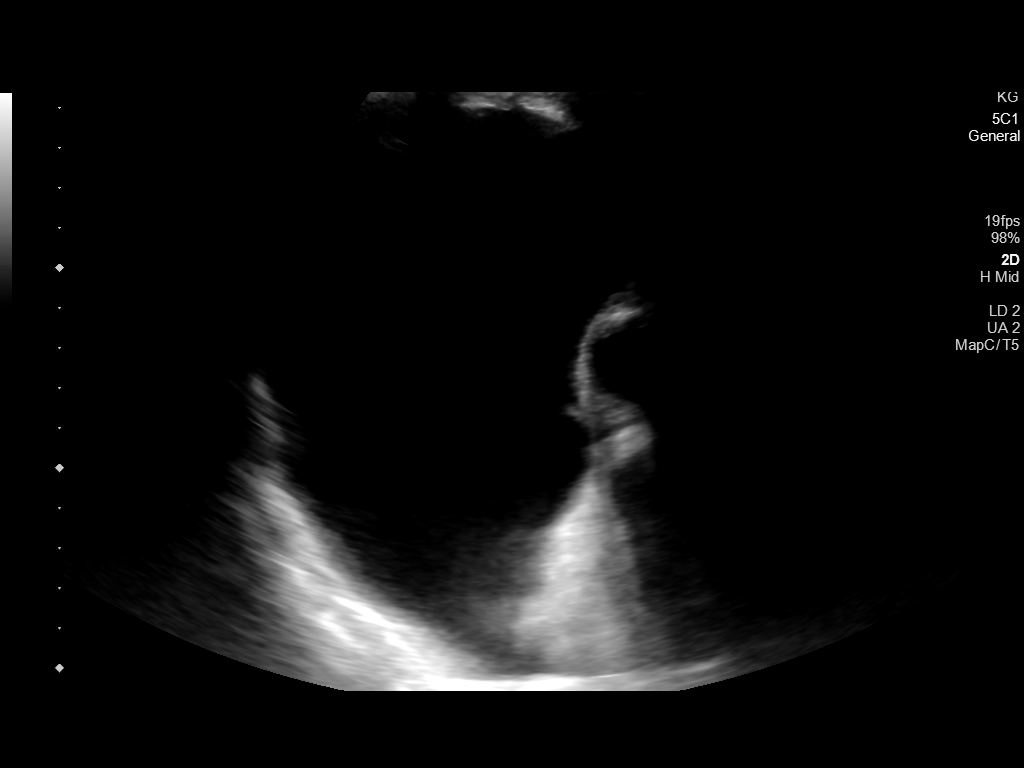
[im 3/5]
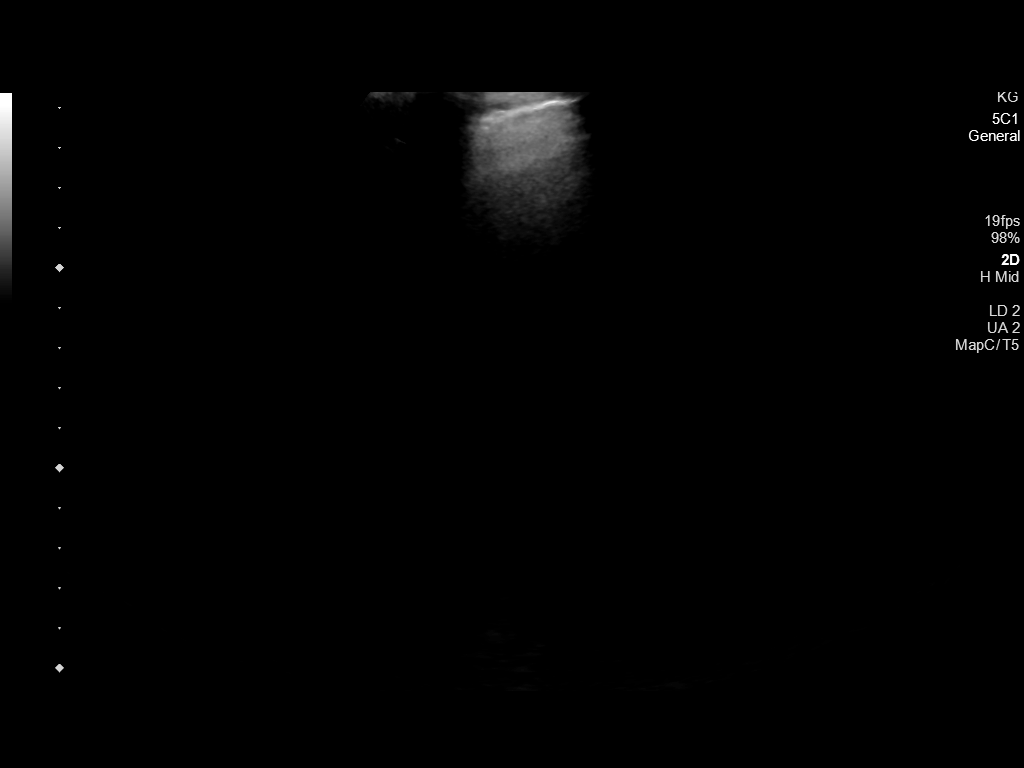
[im 4/5]
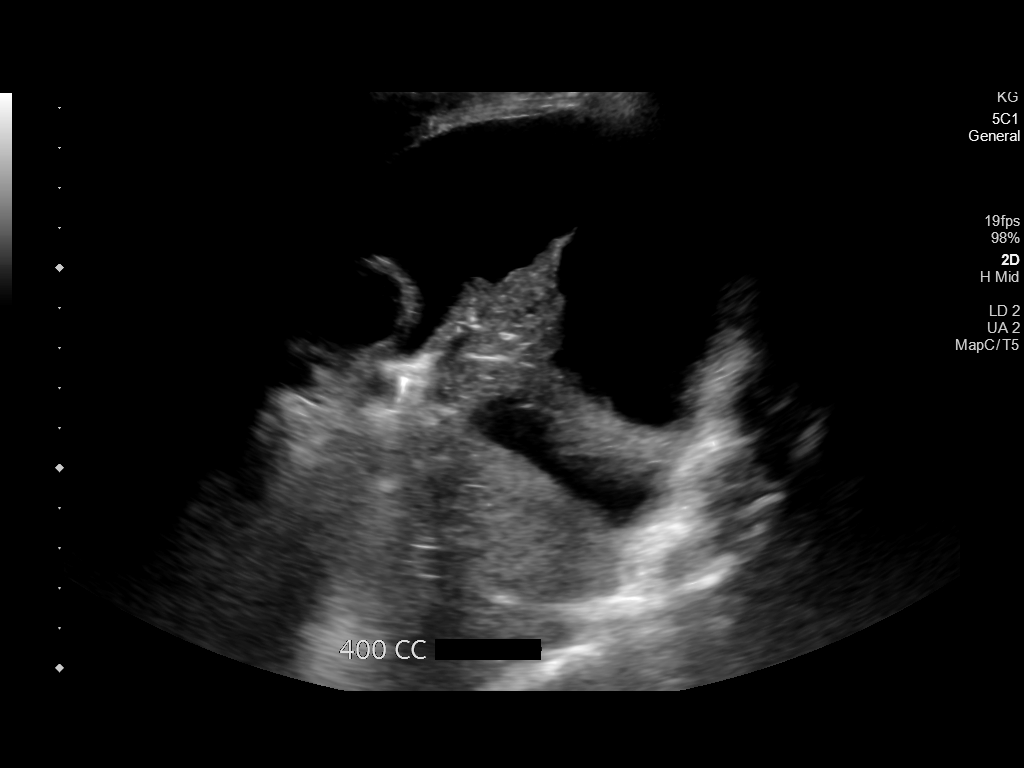
[im 5/5]
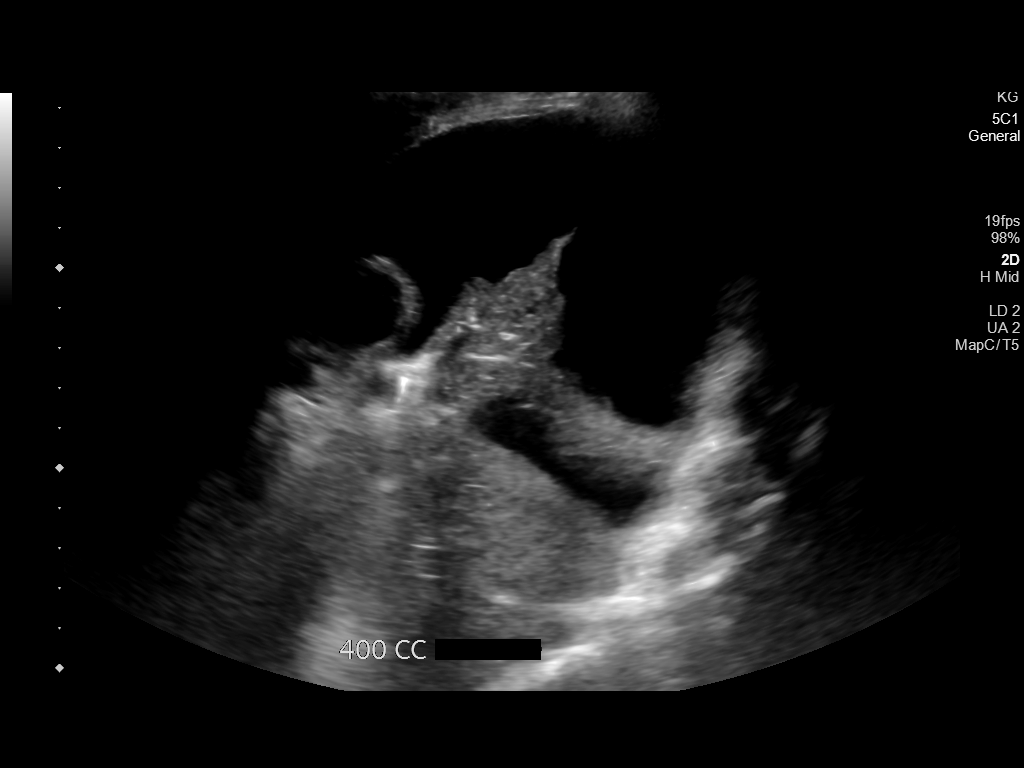

[5 of 5 positions shown; findings below may reference images not displayed]

EXAM:
ULTRASOUND GUIDED DIAGNOSTIC AND THERAPEUTIC THORACENTESIS

MEDICATIONS:
8 mL 1% lidocaine

COMPLICATIONS:
None immediate.

PROCEDURE:
An ultrasound guided thoracentesis was thoroughly discussed with the
patient and questions answered. The benefits, risks, alternatives
and complications were also discussed. The patient understands and
wishes to proceed with the procedure. Written consent was obtained.

Ultrasound was performed to localize and mark an adequate pocket of
fluid in the left chest. The area was then prepped and draped in the
normal sterile fashion. 1% Lidocaine was used for local anesthesia.
Under ultrasound guidance a 6 Fr Safe-T-Centesis catheter was
introduced. Thoracentesis was performed. The catheter was removed
and a dressing applied.
FINDINGS: A total of approximately 400 mL of clear gold fluid was removed.
Procedure was stopped after 400 mL due to patient's chest
discomfort. Samples were sent to the laboratory as requested by the
clinical team.
IMPRESSION: Successful ultrasound guided left thoracentesis yielding 400 mL of
pleural fluid.
